# Patient Record
Sex: Female | Born: 1965
Health system: Southern US, Community
[De-identification: ages and names within clinical notes are randomized; demographics above are authoritative.]

## PROBLEM LIST (undated history)

## (undated) DIAGNOSIS — I10 Essential (primary) hypertension: Secondary | ICD-10-CM

## (undated) DIAGNOSIS — E039 Hypothyroidism, unspecified: Secondary | ICD-10-CM

## (undated) DIAGNOSIS — G500 Trigeminal neuralgia: Secondary | ICD-10-CM

## (undated) DIAGNOSIS — L719 Rosacea, unspecified: Secondary | ICD-10-CM

## (undated) HISTORY — DX: Trigeminal neuralgia: G50.0

## (undated) HISTORY — DX: Hypothyroidism, unspecified: E03.9

## (undated) HISTORY — PX: BUNIONECTOMY: SHX129

## (undated) HISTORY — DX: Rosacea, unspecified: L71.9

## (undated) HISTORY — PX: HAMMER TOE SURGERY: SHX385

---

## 2004-03-17 ENCOUNTER — Ambulatory Visit: Payer: Self-pay | Admitting: General Practice

## 2004-12-30 ENCOUNTER — Emergency Department: Payer: Self-pay | Admitting: Unknown Physician Specialty

## 2005-01-02 ENCOUNTER — Emergency Department: Payer: Self-pay | Admitting: Emergency Medicine

## 2005-01-06 ENCOUNTER — Emergency Department: Payer: Self-pay | Admitting: Emergency Medicine

## 2005-01-13 ENCOUNTER — Emergency Department: Payer: Self-pay | Admitting: Emergency Medicine

## 2005-01-29 ENCOUNTER — Emergency Department: Payer: Self-pay | Admitting: General Practice

## 2005-03-28 ENCOUNTER — Ambulatory Visit: Payer: Self-pay | Admitting: General Practice

## 2006-04-10 ENCOUNTER — Ambulatory Visit: Payer: Self-pay | Admitting: General Practice

## 2008-06-09 ENCOUNTER — Ambulatory Visit: Payer: Self-pay

## 2009-11-04 ENCOUNTER — Ambulatory Visit: Payer: Self-pay

## 2011-03-02 ENCOUNTER — Ambulatory Visit: Payer: Self-pay

## 2013-04-04 ENCOUNTER — Ambulatory Visit: Payer: Self-pay | Admitting: Obstetrics and Gynecology

## 2014-03-14 LAB — HM PAP SMEAR: HM PAP: NEGATIVE

## 2014-04-07 ENCOUNTER — Ambulatory Visit: Payer: Self-pay | Admitting: Obstetrics and Gynecology

## 2014-10-19 ENCOUNTER — Encounter: Payer: Self-pay | Admitting: *Deleted

## 2014-10-20 ENCOUNTER — Ambulatory Visit (INDEPENDENT_AMBULATORY_CARE_PROVIDER_SITE_OTHER): Payer: BLUE CROSS/BLUE SHIELD

## 2014-10-20 VITALS — BP 158/99 | HR 83 | Ht 65.0 in | Wt 181.0 lb

## 2014-10-20 DIAGNOSIS — R030 Elevated blood-pressure reading, without diagnosis of hypertension: Secondary | ICD-10-CM

## 2014-10-20 DIAGNOSIS — E669 Obesity, unspecified: Secondary | ICD-10-CM | POA: Diagnosis not present

## 2014-10-20 DIAGNOSIS — IMO0001 Reserved for inherently not codable concepts without codable children: Secondary | ICD-10-CM

## 2014-10-20 NOTE — Progress Notes (Cosign Needed)
Patient ID: Angela Boone, female   DOB: 1965/07/19, 49 y.o.   MRN: [id number]  Pt presents for wt, bp b12 inj.  NO s/e. Did not bring b12. Will have nurse at employer give it to her.  Pts bp is elevated today- 156/96, repeat- 158/99. Advised to d/c adipex until she speaks with mnb. Bp diary over the next few days. Will contact the office on Friday with an update from [facility].

## 2014-11-10 ENCOUNTER — Encounter: Payer: Self-pay | Admitting: Family Medicine

## 2014-11-10 ENCOUNTER — Ambulatory Visit (INDEPENDENT_AMBULATORY_CARE_PROVIDER_SITE_OTHER): Payer: BLUE CROSS/BLUE SHIELD | Admitting: Family Medicine

## 2014-11-10 VITALS — BP 155/94 | HR 87 | Temp 98.9°F | Ht 63.1 in | Wt 179.9 lb

## 2014-11-10 DIAGNOSIS — R609 Edema, unspecified: Secondary | ICD-10-CM | POA: Diagnosis not present

## 2014-11-10 DIAGNOSIS — Z13220 Encounter for screening for lipoid disorders: Secondary | ICD-10-CM | POA: Diagnosis not present

## 2014-11-10 DIAGNOSIS — G500 Trigeminal neuralgia: Secondary | ICD-10-CM | POA: Diagnosis not present

## 2014-11-10 DIAGNOSIS — E538 Deficiency of other specified B group vitamins: Secondary | ICD-10-CM

## 2014-11-10 DIAGNOSIS — E039 Hypothyroidism, unspecified: Secondary | ICD-10-CM

## 2014-11-10 DIAGNOSIS — I10 Essential (primary) hypertension: Secondary | ICD-10-CM | POA: Diagnosis not present

## 2014-11-10 DIAGNOSIS — R202 Paresthesia of skin: Secondary | ICD-10-CM

## 2014-11-10 DIAGNOSIS — I129 Hypertensive chronic kidney disease with stage 1 through stage 4 chronic kidney disease, or unspecified chronic kidney disease: Secondary | ICD-10-CM | POA: Insufficient documentation

## 2014-11-10 LAB — CBC WITH DIFFERENTIAL/PLATELET
HEMATOCRIT: 41.1 % (ref 34.0–46.6)
Hemoglobin: 14.5 g/dL (ref 11.1–15.9)
Lymphocytes Absolute: 1.2 10*3/uL (ref 0.7–3.1)
Lymphs: 23 %
MCH: 32.8 pg (ref 26.6–33.0)
MCHC: 35.3 g/dL (ref 31.5–35.7)
MCV: 93 fL (ref 79–97)
MID (ABSOLUTE): 0.5 10*3/uL (ref 0.1–1.6)
MID: 10 %
NEUTROS PCT: 67 %
Neutrophils Absolute: 3.6 10*3/uL (ref 1.4–7.0)
PLATELETS: 294 10*3/uL (ref 150–379)
RBC: 4.42 x10E6/uL (ref 3.77–5.28)
RDW: 12.7 % (ref 12.3–15.4)
WBC: 5.3 10*3/uL (ref 3.4–10.8)

## 2014-11-10 LAB — MICROALBUMIN, URINE WAIVED
Creatinine, Urine Waived: 50 mg/dL (ref 10–300)
MICROALB, UR WAIVED: 10 mg/L (ref 0–19)
Microalb/Creat Ratio: <30 mg/g (ref <30)

## 2014-11-10 MED ORDER — HYDROCHLOROTHIAZIDE 25 MG PO TABS: 25.0000 mg | ORAL_TABLET | Freq: Every day | ORAL | Status: DC

## 2014-11-10 NOTE — Patient Instructions (Signed)
Peripheral Edema You have swelling in your legs (peripheral edema). This swelling is due to excess accumulation of salt and water in your body. Edema may be a sign of heart, kidney or liver disease, or a side effect of a medication. It may also be due to problems in the leg veins. Elevating your legs and using special support stockings may be very helpful, if the cause of the swelling is due to poor venous circulation. Avoid long periods of standing, whatever the cause. Treatment of edema depends on identifying the cause. Chips, pretzels, pickles and other salty foods should be avoided. Restricting salt in your diet is almost always needed. Water pills (diuretics) are often used to remove the excess salt and water from your body via urine. These medicines prevent the kidney from reabsorbing sodium. This increases urine flow. Diuretic treatment may also result in lowering of potassium levels in your body. Potassium supplements may be needed if you have to use diuretics daily. Daily weights can help you keep track of your progress in clearing your edema. You should call your caregiver for follow up care as recommended. SEEK IMMEDIATE MEDICAL CARE IF:   You have increased swelling, pain, redness, or heat in your legs.  You develop shortness of breath, especially when lying down.  You develop chest or abdominal pain, weakness, or fainting.  You have a fever. Document Released: 05/25/2004 Document Revised: 07/10/2011 Document Reviewed: 05/05/2009 [facility] Patient Information 2015 [place], [place]. This information is not intended to replace advice given to you by your health care provider. Make sure you discuss any questions you have with your health care provider. DASH Eating Plan DASH stands for "Dietary Approaches to Stop Hypertension." The DASH eating plan is a healthy eating plan that has been shown to reduce high blood pressure (hypertension). Additional health benefits may include reducing the risk  of type 2 diabetes mellitus, heart disease, and stroke. The DASH eating plan may also help with weight loss. WHAT DO I NEED TO KNOW ABOUT THE DASH EATING PLAN? For the DASH eating plan, you will follow these general guidelines:  Choose foods with a percent daily value for sodium of less than 5% (as listed on the food label).  Use salt-free seasonings or herbs instead of table salt or sea salt.  Check with your health care provider or pharmacist before using salt substitutes.  Eat lower-sodium products, often labeled as "lower sodium" or "no salt added."  Eat fresh foods.  Eat more vegetables, fruits, and low-fat dairy products.  Choose whole grains. Look for the word "whole" as the first word in the ingredient list.  Choose fish and skinless chicken or [place] more often than red meat. Limit fish, poultry, and meat to 6 oz (170 g) each day.  Limit sweets, desserts, sugars, and sugary drinks.  Choose heart-healthy fats.  Limit cheese to 1 oz (28 g) per day.  Eat more home-cooked food and less restaurant, buffet, and fast food.  Limit fried foods.  Cook foods using methods other than frying.  Limit canned vegetables. If you do use them, rinse them well to decrease the sodium.  When eating at a restaurant, ask that your food be prepared with less salt, or no salt if possible. WHAT FOODS CAN I EAT? Seek help from a dietitian for individual calorie needs. Grains Whole grain or whole wheat bread. Brown rice. Whole grain or whole wheat pasta. Quinoa, bulgur, and whole grain cereals. Low-sodium cereals. Corn or whole wheat flour tortillas. Whole grain  cornbread. Whole grain crackers. Low-sodium crackers. Vegetables Fresh or frozen vegetables (raw, steamed, roasted, or grilled). Low-sodium or reduced-sodium tomato and vegetable juices. Low-sodium or reduced-sodium tomato sauce and paste. Low-sodium or reduced-sodium canned vegetables.  Fruits All fresh, canned (in natural juice), or  frozen fruits. Meat and Other Protein Products Ground beef (85% or leaner), grass-fed beef, or beef trimmed of fat. Skinless chicken or [place]. Ground chicken or [place]. Pork trimmed of fat. All fish and seafood. Eggs. Dried beans, peas, or lentils. Unsalted nuts and seeds. Unsalted canned beans. Dairy Low-fat dairy products, such as skim or 1% milk, 2% or reduced-fat cheeses, low-fat ricotta or cottage cheese, or plain low-fat yogurt. Low-sodium or reduced-sodium cheeses. Fats and Oils Tub margarines without trans fats. Light or reduced-fat mayonnaise and salad dressings (reduced sodium). Avocado. Safflower, olive, or canola oils. Natural peanut or almond butter. Other Unsalted popcorn and pretzels. The items listed above may not be a complete list of recommended foods or beverages. Contact your dietitian for more options. WHAT FOODS ARE NOT RECOMMENDED? Grains White bread. White pasta. White rice. Refined cornbread. Bagels and croissants. Crackers that contain trans fat. Vegetables Creamed or fried vegetables. Vegetables in a cheese sauce. Regular canned vegetables. Regular canned tomato sauce and paste. Regular tomato and vegetable juices. Fruits Dried fruits. Canned fruit in light or heavy syrup. Fruit juice. Meat and Other Protein Products Fatty cuts of meat. Ribs, chicken wings, bacon, sausage, bologna, salami, chitterlings, fatback, hot dogs, bratwurst, and packaged luncheon meats. Salted nuts and seeds. Canned beans with salt. Dairy Whole or 2% milk, cream, half-and-half, and cream cheese. Whole-fat or sweetened yogurt. Full-fat cheeses or blue cheese. Nondairy creamers and whipped toppings. Processed cheese, cheese spreads, or cheese curds. Condiments Onion and garlic salt, seasoned salt, table salt, and sea salt. Canned and packaged gravies. Worcestershire sauce. Tartar sauce. Barbecue sauce. Teriyaki sauce. Soy sauce, including reduced sodium. Steak sauce. Fish sauce. Oyster sauce.  Cocktail sauce. Horseradish. Ketchup and mustard. Meat flavorings and tenderizers. Bouillon cubes. Hot sauce. Tabasco sauce. Marinades. Taco seasonings. Relishes. Fats and Oils Butter, stick margarine, lard, shortening, ghee, and bacon fat. Coconut, palm kernel, or palm oils. Regular salad dressings. Other Pickles and olives. Salted popcorn and pretzels. The items listed above may not be a complete list of foods and beverages to avoid. Contact your dietitian for more information. WHERE CAN I FIND MORE INFORMATION? National Heart, Lung, and Blood Institute: [employer] Document Released: 04/06/2011 Document Revised: 09/01/2013 Document Reviewed: 02/19/2013 [facility] Patient Information 2015 [place], [place]. This information is not intended to replace advice given to you by your health care provider. Make sure you discuss any questions you have with your health care provider.

## 2014-11-10 NOTE — Assessment & Plan Note (Signed)
Will check TSH today to determine if her levels are doing OK or not. Await results. Adjust as needed.

## 2014-11-10 NOTE — Progress Notes (Signed)
BP 155/94 mmHg  Pulse 87  Temp(Src) 98.9 F (37.2 C)  Ht 5' 3.1" (1.603 m)  Wt 179 lb 14.4 oz (81.602 kg)  BMI 31.76 kg/m2  SpO2 99%   Subjective:    Patient ID: Angela Boone, female    DOB: 09-08-1965, 49 y.o.   MRN: [id number]  HPI: Angela Boone is a 49 y.o. female presents to establish care.   Chief Complaint  Patient presents with  . Hypertension  . Leg Swelling    lower left leg, happens about twice a week  . Numbness    left finger tips will go numb   HYPERTENSION- had a foot injury and started gaining weight, blood pressure started going up Hypertension status: uncontrolled  Satisfied with current treatment? no Duration of hypertension: 3-4 months BP monitoring frequency:  rarely BP range: 150-180 /80+ Aspirin: no Recurrent headaches: yes Visual changes: no Palpitations: no Dyspnea: no Chest pain: no Lower extremity edema: yes- usually off and on about 2x a week. Had ultrasound done about 2 years ago and they noted  Dizzy/lightheaded: no  HYPOTHYROIDISM Thyroid control status:unsure about dose Satisfied with current treatment? no Medication side effects: no Medication compliance: excellent compliance Recent dose adjustment:yes- unclear Fatigue: yes Cold intolerance: no Heat intolerance: no Weight gain: no Weight loss: no Constipation: no Diarrhea/loose stools: no Palpitations: no Lower extremity edema: yes Anxiety/depressed mood: yes  Relevant past medical, surgical, family and social history reviewed and updated as indicated. Interim medical history since our last visit reviewed. Allergies and medications reviewed and updated.  Review of Systems  Constitutional: Negative.   Respiratory: Negative.   Gastrointestinal: Negative.   Genitourinary: Negative.   Psychiatric/Behavioral: Negative for suicidal ideas, hallucinations, behavioral problems, confusion, sleep disturbance, self-injury, dysphoric mood, decreased concentration and  agitation. The patient is nervous/anxious. The patient is not hyperactive.     Per HPI unless specifically indicated above     Objective:    BP 155/94 mmHg  Pulse 87  Temp(Src) 98.9 F (37.2 C)  Ht 5' 3.1" (1.603 m)  Wt 179 lb 14.4 oz (81.602 kg)  BMI 31.76 kg/m2  SpO2 99%  Wt Readings from Last 3 Encounters:  11/10/14 179 lb 14.4 oz (81.602 kg)  10/20/14 181 lb (82.101 kg)    Physical Exam  Constitutional: She is oriented to person, place, and time. She appears well-developed and well-nourished. No distress.  HENT:  Head: Normocephalic and atraumatic.  Right Ear: Hearing normal.  Left Ear: Hearing normal.  Nose: Nose normal.  Eyes: Conjunctivae and lids are normal. Right eye exhibits no discharge. Left eye exhibits no discharge. No scleral icterus.  Cardiovascular: Normal rate, regular rhythm, normal heart sounds and intact distal pulses.  Exam reveals no gallop and no friction rub.   No murmur heard. Pulmonary/Chest: Effort normal and breath sounds normal. No respiratory distress. She has no wheezes. She has no rales. She exhibits no tenderness.  Musculoskeletal: Normal range of motion.  Neurological: She is alert and oriented to person, place, and time.  Skin: Skin is warm, dry and intact. No rash noted. No erythema. No pallor.  Psychiatric: She has a normal mood and affect. Her speech is normal and behavior is normal. Judgment and thought content normal. Cognition and memory are normal.    Results for orders placed or performed in visit on 10/19/14  HM PAP SMEAR  Result Value Ref Range   HM Pap smear negative, HPV negative       Assessment & Plan:  Problem List Items Addressed This Visit      Cardiovascular and Mediastinum   Hypertension - Primary    Has tried dietary changes without benefit. Work on exercise. DASH diet given today. Microalbumin was negative- will start HCTZ to help with the swelling. Recheck with BMP in 1 month.       Relevant Medications    hydrochlorothiazide (HYDRODIURIL) 25 MG tablet   Other Relevant Orders   Comprehensive metabolic panel   Microalbumin, Urine Waived   Basic metabolic panel     Endocrine   Hypothyroidism    Will check TSH today to determine if her levels are doing OK or not. Await results. Adjust as needed.       Relevant Medications   Levothyroxine Sodium 100 MCG CAPS   Other Relevant Orders   Comprehensive metabolic panel   TSH     Nervous and Auditory   Trigeminal neuralgia    Continue to follow with neurology.        Other Visit Diagnoses    Paresthesias        Possibly due to her thyroid or B12. Await blood work.     Relevant Orders    CBC With Differential/Platelet    Comprehensive metabolic panel    Vit D  25 hydroxy (rtn osteoporosis monitoring)    Edema        Checking labs today. Starting HCTZ. Previous vascular eval was negative. No sign of DVT today.     Relevant Orders    CBC With Differential/Platelet    Comprehensive metabolic panel    Vit D  25 hydroxy (rtn osteoporosis monitoring)    Screening for cholesterol level        Checking levels today. Await results.     Relevant Orders    Lipid Panel w/o Chol/HDL Ratio    B12 deficiency        Rechecking levels today. Treat as needed.     Relevant Orders    B12        Follow up plan: Return in about 4 weeks (around 12/08/2014) for BP check with BMP.

## 2014-11-10 NOTE — Assessment & Plan Note (Signed)
Has tried dietary changes without benefit. Work on exercise. DASH diet given today. Microalbumin was negative- will start HCTZ to help with the swelling. Recheck with BMP in 1 month.

## 2014-11-10 NOTE — Assessment & Plan Note (Signed)
Continue to follow with neurology.

## 2014-11-11 LAB — COMPREHENSIVE METABOLIC PANEL
A/G RATIO: 1.8 (ref 1.1–2.5)
ALT: 25 IU/L (ref 0–32)
AST: 23 IU/L (ref 0–40)
Albumin: 4.2 g/dL (ref 3.5–5.5)
Alkaline Phosphatase: 65 IU/L (ref 39–117)
BUN/Creatinine Ratio: 14 (ref 9–23)
BUN: 9 mg/dL (ref 6–24)
Bilirubin Total: <0.2 mg/dL (ref 0.0–1.2)
CHLORIDE: 92 mmol/L — AB (ref 97–108)
CO2: 21 mmol/L (ref 18–29)
Calcium: 9.1 mg/dL (ref 8.7–10.2)
Creatinine, Ser: 0.65 mg/dL (ref 0.57–1.00)
GFR calc Af Amer: 121 mL/min/{1.73_m2} (ref >59)
GFR calc non Af Amer: 105 mL/min/{1.73_m2} (ref >59)
GLOBULIN, TOTAL: 2.3 g/dL (ref 1.5–4.5)
Glucose: 89 mg/dL (ref 65–99)
Potassium: 5 mmol/L (ref 3.5–5.2)
Sodium: 129 mmol/L — ABNORMAL LOW (ref 134–144)
TOTAL PROTEIN: 6.5 g/dL (ref 6.0–8.5)

## 2014-11-11 LAB — LIPID PANEL W/O CHOL/HDL RATIO
Cholesterol, Total: 205 mg/dL — ABNORMAL HIGH (ref 100–199)
HDL: 82 mg/dL (ref >39)
LDL CALC: 109 mg/dL — AB (ref 0–99)
Triglycerides: 70 mg/dL (ref 0–149)
VLDL CHOLESTEROL CAL: 14 mg/dL (ref 5–40)

## 2014-11-12 ENCOUNTER — Telehealth: Payer: Self-pay | Admitting: Family Medicine

## 2014-11-12 DIAGNOSIS — E039 Hypothyroidism, unspecified: Secondary | ICD-10-CM

## 2014-11-12 DIAGNOSIS — E559 Vitamin D deficiency, unspecified: Secondary | ICD-10-CM

## 2014-11-12 LAB — TSH: TSH: 0.16 u[IU]/mL — AB (ref 0.450–4.500)

## 2014-11-12 LAB — VITAMIN B12: Vitamin B-12: 411 pg/mL (ref 211–946)

## 2014-11-12 LAB — VITAMIN D 25 HYDROXY (VIT D DEFICIENCY, FRACTURES): Vit D, 25-Hydroxy: 22 ng/mL — ABNORMAL LOW (ref 30.0–100.0)

## 2014-11-12 MED ORDER — LEVOTHYROXINE SODIUM 88 MCG PO TABS: 88.0000 ug | ORAL_TABLET | Freq: Every day | ORAL | Status: DC

## 2014-11-12 NOTE — Telephone Encounter (Signed)
Please let her know that her cholesterol is borderline high- but nothing we need to treat right now, just work on diet and exercise. Her thyroid is over treated, which may be the cause of the numbness and some of the anxiousness. I'm sending in a new Rx to her pharmacy. She should come back in in 6 weeks for a lab draw (OK to make that appointment for her- also OK to change her BP check appointment to 6 weeks rather than 4 if she'd like). Vitamin D is also very slightly low- take 200IU of D3 OTC and we'll recheck it at the same time. Thank you!

## 2014-11-12 NOTE — Telephone Encounter (Signed)
Patient notified of lab results, changed her appointment to 6weeks.

## 2014-12-08 ENCOUNTER — Ambulatory Visit: Payer: BLUE CROSS/BLUE SHIELD | Admitting: Family Medicine

## 2014-12-17 ENCOUNTER — Other Ambulatory Visit: Payer: Self-pay | Admitting: Family Medicine

## 2014-12-17 NOTE — Telephone Encounter (Signed)
She should not need a refill Dr. Laural Benes wrote for 30+1 in July and needs labs drawn before she runs out of this Rx

## 2014-12-17 NOTE — Telephone Encounter (Signed)
Patient notified

## 2014-12-22 ENCOUNTER — Ambulatory Visit (INDEPENDENT_AMBULATORY_CARE_PROVIDER_SITE_OTHER): Payer: BLUE CROSS/BLUE SHIELD | Admitting: Family Medicine

## 2014-12-22 ENCOUNTER — Encounter: Payer: Self-pay | Admitting: Family Medicine

## 2014-12-22 ENCOUNTER — Other Ambulatory Visit: Payer: Self-pay | Admitting: Family Medicine

## 2014-12-22 VITALS — BP 138/90 | HR 88 | Temp 99.1°F | Wt 181.3 lb

## 2014-12-22 DIAGNOSIS — E039 Hypothyroidism, unspecified: Secondary | ICD-10-CM

## 2014-12-22 DIAGNOSIS — I10 Essential (primary) hypertension: Secondary | ICD-10-CM | POA: Diagnosis not present

## 2014-12-22 DIAGNOSIS — E559 Vitamin D deficiency, unspecified: Secondary | ICD-10-CM | POA: Diagnosis not present

## 2014-12-22 NOTE — Progress Notes (Signed)
BP 138/90 mmHg  Pulse 88  Temp(Src) 99.1 F (37.3 C)  Wt 181 lb 4.8 oz (82.237 kg)  SpO2 99%   Subjective:    Patient ID: Angela Boone, female    DOB: Jun 07, 1965, 49 y.o.   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 Hypothyroidism  . Hypertension   HYPERTENSION Hypertension status: uncontrolled but better on recheck. Very stressed today at work Satisfied with current treatment? yes Duration of hypertension: chronic BP monitoring frequency:  not checking BP medication side effects:  no Medication compliance: good compliance Aspirin: no Recurrent headaches: no Visual changes: no Palpitations: no Dyspnea: no Chest pain: no Lower extremity edema: no Dizzy/lightheaded: no  HYPOTHYROIDISM Thyroid control status:stable Satisfied with current treatment? yes Medication side effects: no Medication compliance: excellent compliance Recent dose adjustment:no Fatigue: no Cold intolerance: no Heat intolerance: no Weight gain: no Weight loss: no Constipation: no Diarrhea/loose stools: no Palpitations: no Lower extremity edema: no Anxiety/depressed mood: no  Due today for recheck on vitamin D as it was slightly low last time. Has been taking OTC vitamin D.  Relevant past medical, surgical, family and social history reviewed and updated as indicated. Interim medical history since our last visit reviewed. Allergies and medications reviewed and updated.  Review of Systems  Constitutional: Negative.   Respiratory: Negative.   Cardiovascular: Negative.   Musculoskeletal: Negative.   Neurological: Negative.   Psychiatric/Behavioral: Negative.     Per HPI unless specifically indicated above     Objective:    BP 138/90 mmHg  Pulse 88  Temp(Src) 99.1 F (37.3 C)  Wt 181 lb 4.8 oz (82.237 kg)  SpO2 99%  Wt Readings from Last 3 Encounters:  12/22/14 181 lb 4.8 oz (82.237 kg)  11/10/14 179 lb 14.4 oz  (81.602 kg)  10/20/14 181 lb (82.101 kg)    Physical Exam  Constitutional: She is oriented to person, place, and time. She appears well-developed and well-nourished. No distress.  HENT:  Head: Normocephalic and atraumatic.  Right Ear: Hearing normal.  Left Ear: Hearing normal.  Nose: Nose normal.  Eyes: Conjunctivae and lids are normal. Right eye exhibits no discharge. Left eye exhibits no discharge. No scleral icterus.  Cardiovascular: Normal rate, regular rhythm, normal heart sounds and intact distal pulses.  Exam reveals no gallop and no friction rub.   No murmur heard. Pulmonary/Chest: Effort normal and breath sounds normal. No respiratory distress. She has no wheezes. She has no rales. She exhibits no tenderness.  Musculoskeletal: Normal range of motion.  Neurological: She is alert and oriented to person, place, and time.  Skin: Skin is intact. No rash noted.  Psychiatric: She has a normal mood and affect. Her speech is normal and behavior is normal. Judgment and thought content normal. Cognition and memory are normal.  Nursing note and vitals reviewed.   Results for orders placed or performed in visit on 11/10/14  Comprehensive metabolic panel  Result Value Ref Range   Glucose 89 65 - 99 mg/dL   BUN 9 6 - 24 mg/dL   Creatinine, Ser 4.09 0.57 - 1.00 mg/dL   GFR calc non Af Amer 105 >59 mL/min/1.73   GFR calc Af Amer 121 >59 mL/min/1.73   BUN/Creatinine Ratio 14 9 - 23   Sodium 129 (L) 134 - 144 mmol/L   Potassium 5.0 3.5 - 5.2 mmol/L   Chloride 92 (L) 97 - 108 mmol/L   CO2 21  18 - 29 mmol/L   Calcium 9.1 8.7 - 10.2 mg/dL   Total Protein 6.5 6.0 - 8.5 g/dL   Albumin 4.2 3.5 - 5.5 g/dL   Globulin, Total 2.3 1.5 - 4.5 g/dL   Albumin/Globulin Ratio 1.8 1.1 - 2.5   Bilirubin Total <0.2 0.0 - 1.2 mg/dL   Alkaline Phosphatase 65 39 - 117 IU/L   AST 23 0 - 40 IU/L   ALT 25 0 - 32 IU/L  Lipid Panel w/o Chol/HDL Ratio  Result Value Ref Range   Cholesterol, Total 205 (H) 100 -  199 mg/dL   Triglycerides 70 0 - 149 mg/dL   HDL 82 >81 mg/dL   VLDL Cholesterol Cal 14 5 - 40 mg/dL   LDL Calculated 191 (H) 0 - 99 mg/dL  Microalbumin, Urine Waived  Result Value Ref Range   Microalb, Ur Waived 10 0 - 19 mg/L   Creatinine, Urine Waived 50 10 - 300 mg/dL   Microalb/Creat Ratio <30 <30 mg/g  TSH  Result Value Ref Range   TSH 0.160 (L) 0.450 - 4.500 uIU/mL  Vit D  25 hydroxy (rtn osteoporosis monitoring)  Result Value Ref Range   Vit D, 25-Hydroxy 22.0 (L) 30.0 - 100.0 ng/mL  B12  Result Value Ref Range   Vitamin B-12 411 211 - 946 pg/mL  CBC With Differential/Platelet  Result Value Ref Range   WBC 5.3 3.4 - 10.8 x10E3/uL   RBC 4.42 3.77 - 5.28 x10E6/uL   Hemoglobin 14.5 11.1 - 15.9 g/dL   Hematocrit 47.8 29.5 - 46.6 %   MCV 93 79 - 97 fL   MCH 32.8 26.6 - 33.0 pg   MCHC 35.3 31.5 - 35.7 g/dL   RDW 62.1 30.8 - 65.7 %   Platelets 294 150 - 379 x10E3/uL   Neutrophils 67 %   Lymphs 23 %   MID 10 %   Neutrophils Absolute 3.6 1.4 - 7.0 x10E3/uL   Lymphocytes Absolute 1.2 0.7 - 3.1 x10E3/uL   MID (Absolute) 0.5 0.1 - 1.6 X10E3/uL      Assessment & Plan:   Problem List Items Addressed This Visit      Cardiovascular and Mediastinum   Hypertension - Primary    Here today for recheck. Not under great control on the HCTZ, but better on recheck. Work on [employer] and decreasing stress.  BMP checked today. Continue to monitor.       Relevant Orders   Basic metabolic panel     Endocrine   Hypothyroidism    Due for recheck today. Labs drawn. Await results and adjust as needed.       Relevant Orders   TSH     Other   Vitamin D deficiency    Due for recheck today. Drawn. Will replace as needed.       Relevant Orders   Vit D  25 hydroxy (rtn osteoporosis monitoring)       Follow up plan: Return in about 6 months (around 06/24/2015).

## 2014-12-22 NOTE — Assessment & Plan Note (Signed)
Due for recheck today. Labs drawn. Await results and adjust as needed.

## 2014-12-22 NOTE — Patient Instructions (Signed)
Managing Your High Blood Pressure Blood pressure is a measurement of how forceful your blood is pressing against the walls of the arteries. Arteries are muscular tubes within the circulatory system. Blood pressure does not stay the same. Blood pressure rises when you are active, excited, or nervous; and it lowers during sleep and relaxation. If the numbers measuring your blood pressure stay above normal most of the time, you are at risk for health problems. High blood pressure (hypertension) is a long-term (chronic) condition in which blood pressure is elevated. A blood pressure reading is recorded as two numbers, such as 120 over 80 (or 120/80). The first, higher number is called the systolic pressure. It is a measure of the pressure in your arteries as the heart beats. The second, lower number is called the diastolic pressure. It is a measure of the pressure in your arteries as the heart relaxes between beats.  Keeping your blood pressure in a normal range is important to your overall health and prevention of health problems, such as heart disease and stroke. When your blood pressure is uncontrolled, your heart has to work harder than normal. High blood pressure is a very common condition in adults because blood pressure tends to rise with age. Men and women are equally likely to have hypertension but at different times in life. Before age 45, men are more likely to have hypertension. After 49 years of age, women are more likely to have it. Hypertension is especially common in African Americans. This condition often has no signs or symptoms. The cause of the condition is usually not known. Your caregiver can help you come up with a plan to keep your blood pressure in a normal, healthy range. BLOOD PRESSURE STAGES Blood pressure is classified into four stages: normal, prehypertension, stage 1, and stage 2. Your blood pressure reading will be used to determine what type of treatment, if any, is necessary.  Appropriate treatment options are tied to these four stages:  Normal  Systolic pressure (mm Hg): below 120.  Diastolic pressure (mm Hg): below 80. Prehypertension  Systolic pressure (mm Hg): 120 to 139.  Diastolic pressure (mm Hg): 80 to 89. Stage1  Systolic pressure (mm Hg): 140 to 159.  Diastolic pressure (mm Hg): 90 to 99. Stage2  Systolic pressure (mm Hg): 160 or above.  Diastolic pressure (mm Hg): 100 or above. RISKS RELATED TO HIGH BLOOD PRESSURE Managing your blood pressure is an important responsibility. Uncontrolled high blood pressure can lead to:  A heart attack.  A stroke.  A weakened blood vessel (aneurysm).  Heart failure.  Kidney damage.  Eye damage.  Metabolic syndrome.  Memory and concentration problems. HOW TO MANAGE YOUR BLOOD PRESSURE Blood pressure can be managed effectively with lifestyle changes and medicines (if needed). Your caregiver will help you come up with a plan to bring your blood pressure within a normal range. Your plan should include the following: Education  Read all information provided by your caregivers about how to control blood pressure.  Educate yourself on the latest guidelines and treatment recommendations. New research is always being done to further define the risks and treatments for high blood pressure. Lifestylechanges  Control your weight.  Avoid smoking.  Stay physically active.  Reduce the amount of salt in your diet.  Reduce stress.  Control any chronic conditions, such as high cholesterol or diabetes.  Reduce your alcohol intake. Medicines  Several medicines (antihypertensive medicines) are available, if needed, to bring blood pressure within a normal range.   Communication  Review all the medicines you take with your caregiver because there may be side effects or interactions.  Talk with your caregiver about your diet, exercise habits, and other lifestyle factors that may be contributing to  high blood pressure.  See your caregiver regularly. Your caregiver can help you create and adjust your plan for managing high blood pressure. RECOMMENDATIONS FOR TREATMENT AND FOLLOW-UP  The following recommendations are based on current guidelines for managing high blood pressure in nonpregnant adults. Use these recommendations to identify the proper follow-up period or treatment option based on your blood pressure reading. You can discuss these options with your caregiver.  Systolic pressure of 120 to 139 or diastolic pressure of 80 to 89: Follow up with your caregiver as directed.  Systolic pressure of 140 to 160 or diastolic pressure of 90 to 100: Follow up with your caregiver within 2 months.  Systolic pressure above 160 or diastolic pressure above 100: Follow up with your caregiver within 1 month.  Systolic pressure above 180 or diastolic pressure above 110: Consider antihypertensive therapy; follow up with your caregiver within 1 week.  Systolic pressure above 200 or diastolic pressure above 120: Begin antihypertensive therapy; follow up with your caregiver within 1 week. Document Released: 01/10/2012 Document Reviewed: 01/10/2012 ExitCare Patient Information 2015 [employer]. This information is not intended to replace advice given to you by your health care provider. Make sure you discuss any questions you have with your health care provider.  DASH Eating Plan DASH stands for "Dietary Approaches to Stop Hypertension." The DASH eating plan is a healthy eating plan that has been shown to reduce high blood pressure (hypertension). Additional health benefits may include reducing the risk of type 2 diabetes mellitus, heart disease, and stroke. The DASH eating plan may also help with weight loss. WHAT DO I NEED TO KNOW ABOUT THE DASH EATING PLAN? For the DASH eating plan, you will follow these general guidelines:  Choose foods with a percent daily value for sodium of less than 5% (as  listed on the food label).  Use salt-free seasonings or herbs instead of table salt or sea salt.  Check with your health care provider or pharmacist before using salt substitutes.  Eat lower-sodium products, often labeled as "lower sodium" or "no salt added."  Eat fresh foods.  Eat more vegetables, fruits, and low-fat dairy products.  Choose whole grains. Look for the word "whole" as the first word in the ingredient list.  Choose fish and skinless chicken or turkey more often than red meat. Limit fish, poultry, and meat to 6 oz (170 g) each day.  Limit sweets, desserts, sugars, and sugary drinks.  Choose heart-healthy fats.  Limit cheese to 1 oz (28 g) per day.  Eat more home-cooked food and less restaurant, buffet, and fast food.  Limit fried foods.  Cook foods using methods other than frying.  Limit canned vegetables. If you do use them, rinse them well to decrease the sodium.  When eating at a restaurant, ask that your food be prepared with less salt, or no salt if possible. WHAT FOODS CAN I EAT? Seek help from a dietitian for individual calorie needs. Grains Whole grain or whole wheat bread. Brown rice. Whole grain or whole wheat pasta. Quinoa, bulgur, and whole grain cereals. Low-sodium cereals. Corn or whole wheat flour tortillas. Whole grain cornbread. Whole grain crackers. Low-sodium crackers. Vegetables Fresh or frozen vegetables (raw, steamed, roasted, or grilled). Low-sodium or reduced-sodium tomato and vegetable juices. Low-sodium   or reduced-sodium tomato sauce and paste. Low-sodium or reduced-sodium canned vegetables.  Fruits All fresh, canned (in natural juice), or frozen fruits. Meat and Other Protein Products Ground beef (85% or leaner), grass-fed beef, or beef trimmed of fat. Skinless chicken or turkey. Ground chicken or turkey. Pork trimmed of fat. All fish and seafood. Eggs. Dried beans, peas, or lentils. Unsalted nuts and seeds. Unsalted canned  beans. Dairy Low-fat dairy products, such as skim or 1% milk, 2% or reduced-fat cheeses, low-fat ricotta or cottage cheese, or plain low-fat yogurt. Low-sodium or reduced-sodium cheeses. Fats and Oils Tub margarines without trans fats. Light or reduced-fat mayonnaise and salad dressings (reduced sodium). Avocado. Safflower, olive, or canola oils. Natural peanut or almond butter. Other Unsalted popcorn and pretzels. The items listed above may not be a complete list of recommended foods or beverages. Contact your dietitian for more options. WHAT FOODS ARE NOT RECOMMENDED? Grains White bread. White pasta. White rice. Refined cornbread. Bagels and croissants. Crackers that contain trans fat. Vegetables Creamed or fried vegetables. Vegetables in a cheese sauce. Regular canned vegetables. Regular canned tomato sauce and paste. Regular tomato and vegetable juices. Fruits Dried fruits. Canned fruit in light or heavy syrup. Fruit juice. Meat and Other Protein Products Fatty cuts of meat. Ribs, chicken wings, bacon, sausage, bologna, salami, chitterlings, fatback, hot dogs, bratwurst, and packaged luncheon meats. Salted nuts and seeds. Canned beans with salt. Dairy Whole or 2% milk, cream, half-and-half, and cream cheese. Whole-fat or sweetened yogurt. Full-fat cheeses or blue cheese. Nondairy creamers and whipped toppings. Processed cheese, cheese spreads, or cheese curds. Condiments Onion and garlic salt, seasoned salt, table salt, and sea salt. Canned and packaged gravies. Worcestershire sauce. Tartar sauce. Barbecue sauce. Teriyaki sauce. Soy sauce, including reduced sodium. Steak sauce. Fish sauce. Oyster sauce. Cocktail sauce. Horseradish. Ketchup and mustard. Meat flavorings and tenderizers. Bouillon cubes. Hot sauce. Tabasco sauce. Marinades. Taco seasonings. Relishes. Fats and Oils Butter, stick margarine, lard, shortening, ghee, and bacon fat. Coconut, palm kernel, or palm oils. Regular salad  dressings. Other Pickles and olives. Salted popcorn and pretzels. The items listed above may not be a complete list of foods and beverages to avoid. Contact your dietitian for more information. WHERE CAN I FIND MORE INFORMATION? National Heart, Lung, and [employer] Institute: www.nhlbi.nih.gov/health/health-topics/topics/dash/ Document Released: 04/06/2011 Document Revised: 09/01/2013 Document Reviewed: 02/19/2013 ExitCare Patient Information 2015 [employer]. This information is not intended to replace advice given to you by your health care provider. Make sure you discuss any questions you have with your health care provider.  

## 2014-12-22 NOTE — Assessment & Plan Note (Addendum)
Here today for recheck. Not under great control on the HCTZ, but better on recheck. Work on [employer] and decreasing stress.  BMP checked today. Continue to monitor.

## 2014-12-22 NOTE — Assessment & Plan Note (Signed)
Due for recheck today. Drawn. Will replace as needed.

## 2014-12-23 ENCOUNTER — Encounter: Payer: Self-pay | Admitting: Family Medicine

## 2014-12-23 LAB — BASIC METABOLIC PANEL
BUN / CREAT RATIO: 13 (ref 9–23)
BUN: 10 mg/dL (ref 6–24)
CO2: 22 mmol/L (ref 18–29)
CREATININE: 0.75 mg/dL (ref 0.57–1.00)
Calcium: 9.2 mg/dL (ref 8.7–10.2)
Chloride: 84 mmol/L — ABNORMAL LOW (ref 97–108)
GFR calc Af Amer: 108 mL/min/{1.73_m2} (ref >59)
GFR, EST NON AFRICAN AMERICAN: 94 mL/min/{1.73_m2} (ref >59)
GLUCOSE: 95 mg/dL (ref 65–99)
Potassium: 3.6 mmol/L (ref 3.5–5.2)
Sodium: 124 mmol/L — ABNORMAL LOW (ref 134–144)

## 2014-12-23 LAB — VITAMIN D 25 HYDROXY (VIT D DEFICIENCY, FRACTURES): VIT D 25 HYDROXY: 30.4 ng/mL (ref 30.0–100.0)

## 2014-12-23 LAB — TSH: TSH: 0.865 u[IU]/mL (ref 0.450–4.500)

## 2015-01-03 ENCOUNTER — Encounter: Payer: Self-pay | Admitting: *Deleted

## 2015-01-03 ENCOUNTER — Emergency Department
Admission: EM | Admit: 2015-01-03 | Discharge: 2015-01-03 | Disposition: A | Discharge status: Home / Self Care | Payer: BLUE CROSS/BLUE SHIELD | Attending: Emergency Medicine | Admitting: Emergency Medicine

## 2015-01-03 ENCOUNTER — Emergency Department: Payer: BLUE CROSS/BLUE SHIELD

## 2015-01-03 DIAGNOSIS — Z79899 Other long term (current) drug therapy: Secondary | ICD-10-CM | POA: Diagnosis not present

## 2015-01-03 DIAGNOSIS — I10 Essential (primary) hypertension: Secondary | ICD-10-CM | POA: Insufficient documentation

## 2015-01-03 DIAGNOSIS — R42 Dizziness and giddiness: Secondary | ICD-10-CM | POA: Insufficient documentation

## 2015-01-03 DIAGNOSIS — Z792 Long term (current) use of antibiotics: Secondary | ICD-10-CM | POA: Diagnosis not present

## 2015-01-03 DIAGNOSIS — R079 Chest pain, unspecified: Secondary | ICD-10-CM

## 2015-01-03 DIAGNOSIS — R112 Nausea with vomiting, unspecified: Secondary | ICD-10-CM | POA: Diagnosis not present

## 2015-01-03 LAB — BASIC METABOLIC PANEL
Anion gap: 10 (ref 5–15)
BUN: 7 mg/dL (ref 6–20)
CALCIUM: 8.9 mg/dL (ref 8.9–10.3)
CO2: 27 mmol/L (ref 22–32)
CREATININE: 0.86 mg/dL (ref 0.44–1.00)
Chloride: 93 mmol/L — ABNORMAL LOW (ref 101–111)
GFR calc Af Amer: >60 mL/min (ref >60)
GLUCOSE: 135 mg/dL — AB (ref 65–99)
Potassium: 3.3 mmol/L — ABNORMAL LOW (ref 3.5–5.1)
Sodium: 130 mmol/L — ABNORMAL LOW (ref 135–145)

## 2015-01-03 LAB — TROPONIN I

## 2015-01-03 LAB — CBC
HCT: 41.2 % (ref 35.0–47.0)
Hemoglobin: 14.6 g/dL (ref 12.0–16.0)
MCH: 32.8 pg (ref 26.0–34.0)
MCHC: 35.3 g/dL (ref 32.0–36.0)
MCV: 92.8 fL (ref 80.0–100.0)
PLATELETS: 312 10*3/uL (ref 150–440)
RBC: 4.44 MIL/uL (ref 3.80–5.20)
RDW: 12.5 % (ref 11.5–14.5)
WBC: 7.4 10*3/uL (ref 3.6–11.0)

## 2015-01-03 NOTE — Discharge Instructions (Signed)

## 2015-01-03 NOTE — ED Notes (Signed)
Patient transported to X-ray 

## 2015-01-03 NOTE — ED Notes (Signed)
Pt woke from sleep w/ sudden onset L chest pain accompanied by diaphoresis, dizziness, nausea, radiation to L arm. Pt presently in no acute distress, although reports continued chest heaviness. Pt has hx of HTN and trigeminal neuralgia and hypothyroidism.

## 2015-01-03 NOTE — ED Notes (Signed)
Patient returns from X ray.

## 2015-01-03 NOTE — ED Provider Notes (Signed)
[facility] Emergency Department Provider Note  ____________________________________________  Time seen: Approximately 7:04 AM  I have reviewed the triage vital signs and the nursing notes.   HISTORY  Chief Complaint Chest Pain    HPI BRIDGIT EYNON is a 49 y.o. female with a history of hypertension who is presenting today with chest pain. She said the pain woke her from sleep at 4:30 AM. She describes the pain as a heaviness under her left breast. She describes associated nausea, vomiting and dizziness. She said that the symptoms lasted about one half hours and have resolved spontaneously. She says that she feels "100% better." Denies any pain with deep inspiration. Does take a progestin based birth control pill. Denies any recent long car trips or plane trips. Says that she does have a family history of heart disease in her father who had an MI at 56 results. She says that she is very active and has not been having any chest pain or shortness of breath with activity lately. Despite the triage note when Asked she says that the pain did not radiate. The pain did not worsen with movement or exertion. Denies any shortness of breath.   Past Medical History  Diagnosis Date  . Hypothyroidism   . Trigeminal neuralgia   . Rosacea     Patient Active Problem List   Diagnosis Date Noted  . Vitamin D deficiency 12/22/2014  . Hypothyroidism 11/10/2014  . Hypertension 11/10/2014  . Trigeminal neuralgia 11/10/2014    History reviewed. No pertinent past surgical history.  Current Outpatient Rx  Name  Route  Sig  Dispense  Refill  . doxycycline (VIBRAMYCIN) 50 MG capsule   Oral   Take 50 mg by mouth daily. Routine medication         . gabapentin (NEURONTIN) 300 MG capsule   Oral   Take 600 mg by mouth 2 (two) times daily. Take two capsules in the morning and three capsules in the evening         . hydrochlorothiazide (HYDRODIURIL) 25 MG tablet   Oral    Take 1 tablet (25 mg total) by mouth daily.   90 tablet   1   . levothyroxine (SYNTHROID, LEVOTHROID) 88 MCG tablet   Oral   Take 1 tablet (88 mcg total) by mouth daily.   30 tablet   1   . norethindrone (MICRONOR,CAMILA,ERRIN) 0.35 MG tablet   Oral   Take 1 tablet by mouth daily.      4   . OXcarbazepine (TRILEPTAL) 150 MG tablet   Oral   Take 450-600 mg by mouth See admin instructions. Takes 3 tablets (450mg ) orally in the morning and 4 tablets (600mg ) orally at bedtime.         . phentermine (ADIPEX-P) 37.5 MG tablet   Intramuscular   Inject 37.5 mg into the muscle every 30 (thirty) days.           Allergies Review of patient's allergies indicates no known allergies.  Family History  Problem Relation Age of Onset  . Cancer Mother     breast  . Hypertension Mother   . Cancer Father     lung  . Heart disease Father   . Diabetes Son 8    Type 1  . Cancer Maternal Grandmother     breast  . Cancer Maternal Grandfather     colon  . Heart disease Paternal Grandfather   . Hypertension Brother     Social History  Social History  Substance Use Topics  . Smoking status: Never Smoker   . Smokeless tobacco: Never Used  . Alcohol Use: Yes     Comment: occas    Review of Systems Constitutional: No fever/chills Eyes: No visual changes. ENT: No sore throat. Cardiovascular: As above  Respiratory: Denies shortness of breath. Gastrointestinal: No abdominal pain.  No nausea, no vomiting.  No diarrhea.  No constipation. Genitourinary: Negative for dysuria. Musculoskeletal: Negative for back pain. Skin: Negative for rash. Neurological: Negative for headaches, focal weakness or numbness.  10-point ROS otherwise negative.  ____________________________________________   PHYSICAL EXAM:  VITAL SIGNS: ED Triage Vitals  Enc Vitals Group     BP 01/03/15 0521 113/74 mmHg     Pulse Rate 01/03/15 0521 68     Resp 01/03/15 0521 16     Temp 01/03/15 0521 97.7 F  (36.5 C)     Temp Source 01/03/15 0521 Oral     SpO2 01/03/15 0521 97 %     Weight 01/03/15 0521 170 lb (77.111 kg)     Height 01/03/15 0521 5\' 4"  (1.626 m)     Head Cir --      Peak Flow --      Pain Score 01/03/15 0522 7     Pain Loc --      Pain Edu? --      Excl. in GC? --     Constitutional: Alert and oriented. Well appearing and in no acute distress. Eyes: Conjunctivae are normal. PERRL. EOMI. Head: Atraumatic. Nose: No congestion/rhinnorhea. Mouth/Throat: Mucous membranes are moist.  Oropharynx non-erythematous. Neck: No stridor.   Cardiovascular: Normal rate, regular rhythm. Grossly normal heart sounds.  Good peripheral circulation. Respiratory: Normal respiratory effort.  No retractions. Lungs CTAB. Gastrointestinal: Soft and nontender. No distention. No abdominal bruits. No CVA tenderness. Musculoskeletal: No lower extremity tenderness nor edema.  No joint effusions. Chest pain is not reproducible palpation. Neurologic:  Normal speech and language. No gross focal neurologic deficits are appreciated. No gait instability. Skin:  Skin is warm, dry and intact. No rash noted. Psychiatric: Mood and affect are normal. Speech and behavior are normal.  ____________________________________________   LABS (all labs ordered are listed, but only abnormal results are displayed)  Labs Reviewed  BASIC METABOLIC PANEL - Abnormal; Notable for the following:    Sodium 130 (*)    Potassium 3.3 (*)    Chloride 93 (*)    Glucose, Bld 135 (*)    All other components within normal limits  CBC  TROPONIN I  TROPONIN I   ____________________________________________  EKG  ED ECG REPORT I, Arelia Longest, the attending physician, personally viewed and interpreted this ECG.   Date: 01/03/2015  EKG Time: 5:17   Rate: 67  Rhythm: normal EKG, normal sinus rhythm  Axis: Normal axis  Intervals:none  ST&T Change: No ST elevations or depressions. No abnormal T-wave  inversions.  ____________________________________________  RADIOLOGY  No active cardiopulmonary disease on the chest x-ray personally viewed these images. ____________________________________________   PROCEDURES   ____________________________________________   INITIAL IMPRESSION / ASSESSMENT AND PLAN / ED COURSE  Pertinent labs & imaging results that were available during my care of the patient were reviewed by me and considered in my medical decision making (see chart for details).  ----------------------------------------- 7:45 AM on 01/03/2015 -----------------------------------------  Patient is resting comfortably at this time and in no acute distress and without any outward signs of any pain. PERC negative. Patient with some classic features  of cardiac chest pain but with normal EKG as well as initial troponin is negative. Also otherwise active without any chest pain or shortness of breath. We'll repeat the patient's troponin. We'll arrange follow-up with cardiology earlier this week and discharged home to follow-up providing that the patient's second troponin is normal and there is no return of her pain.  Discussed the case with Dr. Darrold Junker who recommends the patient call at 8:30 AM on Tuesday morning for an urgent follow-up appointment.  ----------------------------------------- 9:58 AM on 01/03/2015 -----------------------------------------  Patient continues to rest comfortably. Continues to be chest pain-free. We'll discharge to home. Discussed lab results with patient as well as plan. We'll discharge the patient home. Patient understands the plan is going to comply. Will call Dr. Ann Maki shows his office at 8:30 AM on Tuesday for an urgent follow-up appointment. ____________________________________________   FINAL CLINICAL IMPRESSION(S) / ED DIAGNOSES  Acute chest pain. Initial visit.    Myrna Blazer, MD 01/03/15 [phone]

## 2015-01-15 ENCOUNTER — Other Ambulatory Visit: Payer: Self-pay | Admitting: Family Medicine

## 2015-02-08 ENCOUNTER — Encounter: Payer: Self-pay | Admitting: Family Medicine

## 2015-02-08 ENCOUNTER — Ambulatory Visit (INDEPENDENT_AMBULATORY_CARE_PROVIDER_SITE_OTHER): Payer: BLUE CROSS/BLUE SHIELD | Admitting: Family Medicine

## 2015-02-08 VITALS — BP 134/87 | HR 79 | Temp 98.5°F | Ht 64.0 in | Wt 183.0 lb

## 2015-02-08 DIAGNOSIS — M62830 Muscle spasm of back: Secondary | ICD-10-CM

## 2015-02-08 MED ORDER — CYCLOBENZAPRINE HCL 10 MG PO TABS: 10.0000 mg | ORAL_TABLET | Freq: Three times a day (TID) | ORAL | Status: DC | PRN

## 2015-02-08 NOTE — Progress Notes (Signed)
BP 134/87 mmHg  Pulse 79  Temp(Src) 98.5 F (36.9 C)  Ht  (1.626 m)  Wt 183 lb (83.008 kg)  BMI 31.40 kg/m2  SpO2 100%   Subjective:    Patient ID: Angela Boone, female    DOB: Aug 09, 1965, 49 y.o.   MRN: [id number]  HPI: Angela Boone is a 49 y.o. female  Chief Complaint  Patient presents with  . Back Pain    Patient mowed the grass a week ago,her back started hurting, but has gotten progressively worse of the last week. Most of the weekend she was "on the floor", it is mid-low middle back.   BACK PAIN- mowed the lawn, a little sore, then had a bake sale and twisted funny and her back started hurting Duration: about a week ago Mechanism of injury: unknown Location: low back midline Onset: sudden Severity: moderate Quality: sharp Frequency: constant Radiation: none Aggravating factors: movement, bending and prolonged sitting Alleviating factors: rest, ice, heat, laying and NSAIDs Status: better Treatments attempted: rest, heat and ibuprofen  Relief with NSAIDs?: mild Nighttime pain:  no Paresthesias / decreased sensation:  no Bowel / bladder incontinence:  no Fevers:  no Dysuria / urinary frequency:  no   Relevant past medical, surgical, family and social history reviewed and updated as indicated. Interim medical history since our last visit reviewed. Allergies and medications reviewed and updated.  Review of Systems  Constitutional: Negative.   Respiratory: Negative.   Gastrointestinal: Negative.   Musculoskeletal: Positive for myalgias and back pain. Negative for joint swelling, arthralgias, gait problem, neck pain and neck stiffness.  Psychiatric/Behavioral: Negative.     Per HPI unless specifically indicated above     Objective:    BP 134/87 mmHg  Pulse 79  Temp(Src) 98.5 F (36.9 C)  Ht  (1.626 m)  Wt 183 lb (83.008 kg)  BMI 31.40 kg/m2  SpO2 100%  Wt Readings from Last 3 Encounters:  02/08/15 183 lb (83.008 kg)  01/03/15 170 lb  (77.111 kg)  12/22/14 181 lb 4.8 oz (82.237 kg)    Physical Exam  Constitutional: She is oriented to person, place, and time. She appears well-developed and well-nourished. No distress.  HENT:  Head: Normocephalic and atraumatic.  Right Ear: Hearing normal.  Left Ear: Hearing normal.  Nose: Nose normal.  Eyes: Conjunctivae and lids are normal. Right eye exhibits no discharge. Left eye exhibits no discharge. No scleral icterus.  Cardiovascular: Normal rate, regular rhythm, normal heart sounds and intact distal pulses.  Exam reveals no gallop and no friction rub.   No murmur heard. Pulmonary/Chest: Effort normal and breath sounds normal. No respiratory distress. She has no wheezes. She has no rales. She exhibits no tenderness.  Neurological: She is alert and oriented to person, place, and time.  Skin: Skin is warm, dry and intact. No rash noted. No erythema. No pallor.  Psychiatric: She has a normal mood and affect. Her speech is normal and behavior is normal. Judgment and thought content normal. Cognition and memory are normal.  Nursing note and vitals reviewed. Back Exam:    Inspection:  Normal spinal curvature.  No deformity, ecchymosis, erythema, or lesions     Palpation:     Midline spinal tenderness: no      Paralumbar tenderness: yes Right paraspinal lower lumbar and sacral     Parathoracic tenderness: no      Buttocks tenderness: no     Range of Motion:      Flexion: Fingers  to Knees     Extension:Decreased     Lateral bending:Decreased    Rotation:Decreased    Neuro Exam:Lower extremity DTRs normal & symmetric.  Strength and sensation intact.    Special Tests:      Straight leg raise:negative  Results for orders placed or performed during the hospital encounter of 01/03/15  Basic metabolic panel  Result Value Ref Range   Sodium 130 (L) 135 - 145 mmol/L   Potassium 3.3 (L) 3.5 - 5.1 mmol/L   Chloride 93 (L) 101 - 111 mmol/L   CO2 27 22 - 32 mmol/L   Glucose, Bld 135 (H)  65 - 99 mg/dL   BUN 7 6 - 20 mg/dL   Creatinine, Ser 6.96 0.44 - 1.00 mg/dL   Calcium 8.9 8.9 - 29.5 mg/dL   GFR calc non Af Amer >60 >60 mL/min   GFR calc Af Amer >60 >60 mL/min   Anion gap 10 5 - 15  CBC  Result Value Ref Range   WBC 7.4 3.6 - 11.0 K/uL   RBC 4.44 3.80 - 5.20 MIL/uL   Hemoglobin 14.6 12.0 - 16.0 g/dL   HCT 28.4 13.2 - 44.0 %   MCV 92.8 80.0 - 100.0 fL   MCH 32.8 26.0 - 34.0 pg   MCHC 35.3 32.0 - 36.0 g/dL   RDW 10.2 72.5 - 36.6 %   Platelets 312 150 - 440 K/uL  Troponin I  Result Value Ref Range   Troponin I <0.03 <0.031 ng/mL  Troponin I  Result Value Ref Range   Troponin I <0.03 <0.031 ng/mL      Assessment & Plan:   Problem List Items Addressed This Visit    None    Visit Diagnoses    Muscle spasm of back    -  Primary    Muscle spasm. Continue ibuprofen. Start flexeril. Keep up heat and massages. Exercises given. Continue to monitor. Recheck in 2 weeks if not better.         Follow up plan: Return in about 2 weeks (around 02/22/2015) for follow up back pain.

## 2015-02-08 NOTE — Patient Instructions (Signed)

## 2015-02-22 ENCOUNTER — Ambulatory Visit: Payer: BLUE CROSS/BLUE SHIELD | Admitting: Family Medicine

## 2015-03-10 ENCOUNTER — Encounter: Payer: Self-pay | Admitting: Obstetrics and Gynecology

## 2015-03-10 ENCOUNTER — Encounter (INDEPENDENT_AMBULATORY_CARE_PROVIDER_SITE_OTHER): Payer: Self-pay

## 2015-03-10 ENCOUNTER — Ambulatory Visit (INDEPENDENT_AMBULATORY_CARE_PROVIDER_SITE_OTHER): Payer: BLUE CROSS/BLUE SHIELD | Admitting: Obstetrics and Gynecology

## 2015-03-10 VITALS — BP 149/89 | HR 79 | Ht 64.0 in | Wt 181.5 lb

## 2015-03-10 DIAGNOSIS — Z01419 Encounter for gynecological examination (general) (routine) without abnormal findings: Secondary | ICD-10-CM

## 2015-03-10 NOTE — Patient Instructions (Signed)
Place annual gynecologic exam patient instructions here.

## 2015-03-10 NOTE — Progress Notes (Signed)
  Subjective:     Angela Boone is a 49 y.o. female and is here for a comprehensive physical exam. The patient reports no problems.  Social History   Social History  . Marital Status: Divorced    Spouse Name: N/A  . Number of Children: N/A  . Years of Education: N/A   Occupational History  . Not on file.   Social History Main Topics  . Smoking status: Never Smoker   . Smokeless tobacco: Never Used  . Alcohol Use: Yes     Comment: occas  . Drug Use: No  . Sexual Activity: Yes    Birth Control/ Protection: None   Other Topics Concern  . Not on file   Social History Narrative   Health Maintenance  Topic Date Due  . Janet BerlinETANUS/TDAP  07/21/1984  . HIV Screening  11/10/2015 (Originally 07/21/1980)  . INFLUENZA VACCINE  11/30/2015  . PAP SMEAR  03/14/2017    The following portions of the patient's history were reviewed and updated as appropriate: allergies, current medications, past family history, past medical history, past social history, past surgical history and problem list.  Review of Systems A comprehensive review of systems was negative.   Objective:    General appearance: alert, cooperative and appears stated age Neck: no adenopathy, no carotid bruit, no JVD, supple, symmetrical, trachea midline and thyroid not enlarged, symmetric, no tenderness/mass/nodules Lungs: clear to auscultation bilaterally Breasts: normal appearance, no masses or tenderness Heart: regular rate and rhythm, S1, S2 normal, no murmur, click, rub or gallop Abdomen: soft, non-tender; bowel sounds normal; no masses,  no organomegaly Pelvic: cervix normal in appearance, external genitalia normal, no adnexal masses or tenderness, no cervical motion tenderness, rectovaginal septum normal, uterus normal size, shape, and consistency and vagina normal without discharge    Assessment:    Healthy female exam. OCP user      Plan:  MMG ordered and refills given RTC 1 year or as needed.   See After  Visit Summary for Counseling Recommendations

## 2015-04-14 ENCOUNTER — Ambulatory Visit (INDEPENDENT_AMBULATORY_CARE_PROVIDER_SITE_OTHER): Payer: BLUE CROSS/BLUE SHIELD | Admitting: Obstetrics and Gynecology

## 2015-04-14 VITALS — BP 138/88 | HR 92 | Wt 181.4 lb

## 2015-04-14 DIAGNOSIS — E669 Obesity, unspecified: Secondary | ICD-10-CM

## 2015-04-14 MED ORDER — CYANOCOBALAMIN 1000 MCG/ML IJ SOLN
1000.0000 ug | Freq: Once | INTRAMUSCULAR | Status: AC
  Administered 2015-04-14: 1000 ug via INTRAMUSCULAR

## 2015-04-14 NOTE — Progress Notes (Cosign Needed)
Pt is here for wt, bp check, b-12 inj Denies any s/e, has been doing a boot-camp class q am, pt was very discouraged about not losing any weight, however she said she could button a pair of jeans she hasnt been able to Advised her she is probably losing inches  03/10/15 wt-181

## 2015-04-29 ENCOUNTER — Ambulatory Visit
Admission: RE | Admit: 2015-04-29 | Discharge: 2015-04-29 | Disposition: A | Discharge status: Home / Self Care | Payer: BLUE CROSS/BLUE SHIELD | Source: Ambulatory Visit | Attending: Obstetrics and Gynecology | Admitting: Obstetrics and Gynecology

## 2015-04-29 DIAGNOSIS — Z01419 Encounter for gynecological examination (general) (routine) without abnormal findings: Secondary | ICD-10-CM

## 2015-04-29 DIAGNOSIS — Z1231 Encounter for screening mammogram for malignant neoplasm of breast: Secondary | ICD-10-CM | POA: Insufficient documentation

## 2015-05-05 ENCOUNTER — Other Ambulatory Visit: Payer: Self-pay | Admitting: Family Medicine

## 2015-05-13 ENCOUNTER — Ambulatory Visit: Payer: BLUE CROSS/BLUE SHIELD

## 2015-05-18 ENCOUNTER — Ambulatory Visit: Payer: BLUE CROSS/BLUE SHIELD

## 2015-05-21 ENCOUNTER — Ambulatory Visit: Payer: BLUE CROSS/BLUE SHIELD

## 2015-06-24 ENCOUNTER — Ambulatory Visit: Payer: BLUE CROSS/BLUE SHIELD | Admitting: Family Medicine

## 2015-07-01 ENCOUNTER — Ambulatory Visit (INDEPENDENT_AMBULATORY_CARE_PROVIDER_SITE_OTHER): Payer: BLUE CROSS/BLUE SHIELD | Admitting: Family Medicine

## 2015-07-01 ENCOUNTER — Encounter: Payer: Self-pay | Admitting: Family Medicine

## 2015-07-01 ENCOUNTER — Other Ambulatory Visit: Payer: Self-pay | Admitting: Family Medicine

## 2015-07-01 VITALS — BP 121/81 | HR 93 | Temp 99.0°F | Ht 63.0 in | Wt 181.0 lb

## 2015-07-01 DIAGNOSIS — I10 Essential (primary) hypertension: Secondary | ICD-10-CM

## 2015-07-01 DIAGNOSIS — Z23 Encounter for immunization: Secondary | ICD-10-CM

## 2015-07-01 NOTE — Assessment & Plan Note (Signed)
Under good control. Continue current regimen. Continue to monitor. Recheck 6 months. BMP drawn today.

## 2015-07-01 NOTE — Progress Notes (Signed)
BP 121/81 mmHg  Pulse 93  Temp(Src) 99 F (37.2 C)  Ht  (1.6 m)  Wt 181 lb (82.101 kg)  BMI 32.07 kg/m2  SpO2 99%   Subjective:    Patient ID: Angela Boone, female    DOB: June 25, 1965, 50 y.o.   MRN: [id number]  HPI: Angela Boone is a 50 y.o. female  Chief Complaint  Patient presents with  . Hypertension   HYPERTENSION Hypertension status: controlled  Satisfied with current treatment? yes Duration of hypertension: chronic BP monitoring frequency:  a few times a month BP medication side effects:  no Medication compliance: excellent compliance Aspirin: no Recurrent headaches: no Visual changes: no Palpitations: no Dyspnea: no Chest pain: no Lower extremity edema: no Dizzy/lightheaded: no   Relevant past medical, surgical, family and social history reviewed and updated as indicated. Interim medical history since our last visit reviewed. Allergies and medications reviewed and updated.  Review of Systems  Constitutional: Negative.   Respiratory: Negative.   Cardiovascular: Negative.   Psychiatric/Behavioral: Negative.    Per HPI unless specifically indicated above     Objective:    BP 121/81 mmHg  Pulse 93  Temp(Src) 99 F (37.2 C)  Ht  (1.6 m)  Wt 181 lb (82.101 kg)  BMI 32.07 kg/m2  SpO2 99%  Wt Readings from Last 3 Encounters:  07/01/15 181 lb (82.101 kg)  04/14/15 181 lb 6.4 oz (82.283 kg)  03/10/15 181 lb 8 oz (82.328 kg)    Physical Exam  Constitutional: She is oriented to person, place, and time. She appears well-developed and well-nourished. No distress.  HENT:  Head: Normocephalic and atraumatic.  Right Ear: Hearing normal.  Left Ear: Hearing normal.  Nose: Nose normal.  Eyes: Conjunctivae and lids are normal. Right eye exhibits no discharge. Left eye exhibits no discharge. No scleral icterus.  Cardiovascular: Normal rate, regular rhythm, normal heart sounds and intact distal pulses.  Exam reveals no gallop and no  friction rub.   No murmur heard. Pulmonary/Chest: Effort normal and breath sounds normal. No respiratory distress. She has no wheezes. She has no rales. She exhibits no tenderness.  Musculoskeletal: Normal range of motion.  Neurological: She is alert and oriented to person, place, and time.  Skin: Skin is warm, dry and intact. No rash noted. No erythema. No pallor.  Psychiatric: She has a normal mood and affect. Her speech is normal and behavior is normal. Judgment and thought content normal. Cognition and memory are normal.  Nursing note and vitals reviewed.   Results for orders placed or performed during the hospital encounter of 01/03/15  Basic metabolic panel  Result Value Ref Range   Sodium 130 (L) 135 - 145 mmol/L   Potassium 3.3 (L) 3.5 - 5.1 mmol/L   Chloride 93 (L) 101 - 111 mmol/L   CO2 27 22 - 32 mmol/L   Glucose, Bld 135 (H) 65 - 99 mg/dL   BUN 7 6 - 20 mg/dL   Creatinine, Ser 4.09 0.44 - 1.00 mg/dL   Calcium 8.9 8.9 - 81.1 mg/dL   GFR calc non Af Amer >60 >60 mL/min   GFR calc Af Amer >60 >60 mL/min   Anion gap 10 5 - 15  CBC  Result Value Ref Range   WBC 7.4 3.6 - 11.0 K/uL   RBC 4.44 3.80 - 5.20 MIL/uL   Hemoglobin 14.6 12.0 - 16.0 g/dL   HCT 91.4 78.2 - 95.6 %   MCV 92.8 80.0 - 100.0  fL   MCH 32.8 26.0 - 34.0 pg   MCHC 35.3 32.0 - 36.0 g/dL   RDW 40.9 81.1 - 91.4 %   Platelets 312 150 - 440 K/uL  Troponin I  Result Value Ref Range   Troponin I <0.03 <0.031 ng/mL  Troponin I  Result Value Ref Range   Troponin I <0.03 <0.031 ng/mL      Assessment & Plan:   Problem List Items Addressed This Visit      Cardiovascular and Mediastinum   Hypertension - Primary    Under good control. Continue current regimen. Continue to monitor. Recheck 6 months. BMP drawn today.       Other Visit Diagnoses    Immunization due        Tdap given today    Relevant Orders    Tdap vaccine greater than or equal to 7yo IM (Completed)        Follow up plan: Return in  about 6 months (around 01/01/2016) for Physical.

## 2015-07-02 ENCOUNTER — Encounter: Payer: Self-pay | Admitting: Family Medicine

## 2015-07-02 LAB — BASIC METABOLIC PANEL
BUN / CREAT RATIO: 12 (ref 9–23)
BUN: 11 mg/dL (ref 6–24)
CO2: 20 mmol/L (ref 18–29)
CREATININE: 0.9 mg/dL (ref 0.57–1.00)
Calcium: 9.2 mg/dL (ref 8.7–10.2)
Chloride: 94 mmol/L — ABNORMAL LOW (ref 96–106)
GFR calc Af Amer: 87 mL/min/{1.73_m2} (ref >59)
GFR, EST NON AFRICAN AMERICAN: 75 mL/min/{1.73_m2} (ref >59)
Glucose: 89 mg/dL (ref 65–99)
Potassium: 4.3 mmol/L (ref 3.5–5.2)
SODIUM: 133 mmol/L — AB (ref 134–144)

## 2015-09-24 DIAGNOSIS — M2042 Other hammer toe(s) (acquired), left foot: Secondary | ICD-10-CM | POA: Diagnosis not present

## 2015-09-24 DIAGNOSIS — M7752 Other enthesopathy of left foot: Secondary | ICD-10-CM | POA: Diagnosis not present

## 2015-09-24 DIAGNOSIS — M2011 Hallux valgus (acquired), right foot: Secondary | ICD-10-CM | POA: Diagnosis not present

## 2015-09-24 DIAGNOSIS — M2012 Hallux valgus (acquired), left foot: Secondary | ICD-10-CM | POA: Diagnosis not present

## 2015-09-29 ENCOUNTER — Encounter: Payer: Self-pay | Admitting: Obstetrics and Gynecology

## 2015-09-29 ENCOUNTER — Ambulatory Visit (INDEPENDENT_AMBULATORY_CARE_PROVIDER_SITE_OTHER): Payer: BLUE CROSS/BLUE SHIELD | Admitting: Obstetrics and Gynecology

## 2015-09-29 VITALS — BP 143/82 | HR 73 | Ht 64.0 in | Wt 186.3 lb

## 2015-09-29 DIAGNOSIS — E669 Obesity, unspecified: Secondary | ICD-10-CM | POA: Diagnosis not present

## 2015-09-29 MED ORDER — CYANOCOBALAMIN 1000 MCG/ML IJ SOLN: 1000.0000 ug | Freq: Once | INTRAMUSCULAR | Status: DC

## 2015-10-06 NOTE — Progress Notes (Signed)
SUBJECTIVE:  50 y.o. here for follow-up weight loss visit, previously seen 4 weeks ago. Denies any concerns and feels like medication is working, but needs to improve on exercise.  OBJECTIVE:  BP 143/82 mmHg  Pulse 73  Ht 5\' 4"  (1.626 m)  Wt 186 lb 4.8 oz (84.505 kg)  BMI 31.96 kg/m2  Body mass index is 31.96 kg/(m^2). Patient appears well. ASSESSMENT:  Obesity- responding well to weight loss plan PLAN:  To continue with current medications. B12 103200mcg/ml injection given RTC in 4 weeks as planned  Callaway Hardigree TrappeShambley, CNM

## 2015-10-15 ENCOUNTER — Telehealth: Payer: Self-pay | Admitting: Obstetrics and Gynecology

## 2015-10-15 ENCOUNTER — Other Ambulatory Visit: Payer: Self-pay | Admitting: Obstetrics and Gynecology

## 2015-10-15 ENCOUNTER — Ambulatory Visit
Admission: EM | Admit: 2015-10-15 | Discharge: 2015-10-15 | Disposition: A | Discharge status: Home / Self Care | Payer: BLUE CROSS/BLUE SHIELD | Attending: Family Medicine | Admitting: Family Medicine

## 2015-10-15 DIAGNOSIS — K602 Anal fissure, unspecified: Secondary | ICD-10-CM

## 2015-10-15 DIAGNOSIS — M2011 Hallux valgus (acquired), right foot: Secondary | ICD-10-CM | POA: Diagnosis not present

## 2015-10-15 DIAGNOSIS — M2012 Hallux valgus (acquired), left foot: Secondary | ICD-10-CM | POA: Diagnosis not present

## 2015-10-15 DIAGNOSIS — Z0000 Encounter for general adult medical examination without abnormal findings: Secondary | ICD-10-CM

## 2015-10-15 DIAGNOSIS — R1030 Lower abdominal pain, unspecified: Secondary | ICD-10-CM | POA: Diagnosis not present

## 2015-10-15 DIAGNOSIS — M7752 Other enthesopathy of left foot: Secondary | ICD-10-CM | POA: Diagnosis not present

## 2015-10-15 DIAGNOSIS — M2042 Other hammer toe(s) (acquired), left foot: Secondary | ICD-10-CM | POA: Diagnosis not present

## 2015-10-15 HISTORY — DX: Essential (primary) hypertension: I10

## 2015-10-15 NOTE — ED Provider Notes (Signed)
CSN: [id number]     Arrival date & time 10/15/15  1415 History   First MD Initiated Contact with Patient 10/15/15 1448     Chief Complaint  Patient presents with  . Abdominal Pain   (Consider location/radiation/quality/duration/timing/severity/associated sxs/prior Treatment) HPI Comments: 50 yo female with a c/o lower abdominal pain last night after eating. States was in left lower abdomen. Denies any fevers, chills, vomiting, diarrhea, constipation. However states she did a fleet enema last night to see if it would give her relief. Had several bowel movements and today felt slightly better. States would not have come in to urgent care today, except she went to the bathroom at work and noticed bright red blood on the toilet paper and in the toilet. Denies any anal pain.   The history is provided by the patient.    Past Medical History  Diagnosis Date  . Hypothyroidism   . Trigeminal neuralgia   . Rosacea   . Hypertension    History reviewed. No pertinent past surgical history. Family History  Problem Relation Age of Onset  . Cancer Mother     breast  . Hypertension Mother   . Breast cancer Mother [id number]  . Cancer Father     lung  . Heart disease Father   . Diabetes Son 8    Type 1  . Cancer Maternal Grandmother     breast  . Breast cancer Maternal Grandmother [id number]  . Cancer Maternal Grandfather     colon  . Heart disease Paternal Grandfather   . Hypertension Brother    Social History  Substance Use Topics  . Smoking status: Never Smoker   . Smokeless tobacco: Never Used  . Alcohol Use: Yes     Comment: occas   OB History    No data available     Review of Systems  Allergies  Review of patient's allergies indicates no known allergies.  Home Medications   Prior to Admission medications   Medication Sig Start Date End Date Taking? Authorizing Provider  gabapentin (NEURONTIN) 300 MG capsule Take 600 mg by mouth 2 (two) times daily. Take two capsules in the morning  and three capsules in the evening   Yes Historical Provider, MD  hydrochlorothiazide (HYDRODIURIL) 25 MG tablet TAKE (1) TABLET BY MOUTH EVERY DAY 05/06/15  Yes Megan P Johnson, DO  levothyroxine (SYNTHROID, LEVOTHROID) 88 MCG tablet TAKE 1 TABLET ON AN EMPTY STOMACH WITH A GLASS OF WATER AT LEAST 30 TO 60 MINUTES BEFORE BREAKFAST. 01/15/15  Yes Megan P Johnson, DO  norethindrone (MICRONOR,CAMILA,ERRIN) 0.35 MG tablet Take 1 tablet by mouth daily. 12/14/14  Yes Historical Provider, MD  OXcarbazepine (TRILEPTAL) 150 MG tablet Take 450-600 mg by mouth See admin instructions. Takes 3 tablets (450mg ) orally in the morning and 4 tablets (600mg ) orally at bedtime.   Yes Historical Provider, MD  doxycycline (VIBRAMYCIN) 50 MG capsule Take 50 mg by mouth daily. Routine medication    Historical Provider, MD   Meds Ordered and Administered this Visit  Medications - No data to display  BP 171/95 mmHg  Pulse 90  Temp(Src) 97.9 F (36.6 C) (Oral)  Resp 16  Ht 5\' 4"  (1.626 m)  Wt 180 lb (81.647 kg)  BMI 30.88 kg/m2  SpO2 100% No data found.   Physical Exam  Constitutional: She appears well-developed and well-nourished. No distress.  Cardiovascular: Normal rate, regular rhythm, normal heart sounds and intact distal pulses.   No murmur heard. Pulmonary/Chest: Effort normal and breath  sounds normal. No respiratory distress. She has no wheezes. She has no rales.  Abdominal: Soft. Bowel sounds are normal. She exhibits no distension and no mass. There is tenderness (mild/minimal  left and mid lower quadrant tenderness to palpation; no rebound or guarding). There is no rebound and no guarding.  Positive anal fissures noted (x2); no active bleeding at this time.  Neurological: She is alert.  Skin: Skin is warm and dry. No rash noted. She is not diaphoretic. No erythema.  Nursing note and vitals reviewed.   ED Course  Procedures (including critical care time)  Labs Review Labs Reviewed - No data to  display  Imaging Review No results found.   Visual Acuity Review  Right Eye Distance:   Left Eye Distance:   Bilateral Distance:    Right Eye Near:   Left Eye Near:    Bilateral Near:         MDM   1. Anal fissure   2. Lower abdominal pain    1. diagnosis reviewed with patient 2. rx as per orders above; reviewed possible side effects, interactions, risks and benefits  3. Recommend supportive treatment with sitz baths, increased fluids/clear liquids and advance to BRAT diet, then full diet as tolerated 4. Follow-up prn if symptoms worsen or don't improve    Payton Mccallum, MD 10/15/15 1600

## 2015-10-15 NOTE — ED Notes (Signed)
Patient states last night after dinner she had severe stomach pain on her left lower abdomen. Patient used an Enema for some relief last night and was able to have a bowel movement. She used the restroom 30 mins ago and upon wiping there was bright red blood present.

## 2015-10-15 NOTE — Telephone Encounter (Signed)
[facility] and [facility] I checked and there is no order in for a mammo. Can you put order in?

## 2015-10-15 NOTE — Telephone Encounter (Signed)
Done 10/15/15

## 2015-10-15 NOTE — Discharge Instructions (Signed)
Anal Fissure, Adult An anal fissure is a small tear or crack in the skin around the anus. Bleeding from a fissure usually stops on its own within a few minutes. However, bleeding will often occur again with each bowel movement until the crack heals. CAUSES This condition may be caused by:  Passing large, hard stool (feces).  Frequent diarrhea.  Constipation.  Inflammatory bowel disease (Crohn disease or ulcerative colitis).  Infections.  Anal sex. SYMPTOMS Symptoms of this condition include:  Bleeding from the rectum.  Small amounts of blood seen on your stool, on toilet paper, or in the toilet after a bowel movement.  Painful bowel movements.  Itching or irritation around the anus. DIAGNOSIS A health care provider may diagnose this condition by closely examining the anal area. An anal fissure can usually be seen with careful inspection. In some cases, a rectal exam may be performed, or a short tube (anoscope) may be used to examine the anal canal. TREATMENT Treatment for this condition may include:  Taking steps to avoid constipation. This may include making changes to your diet, such as increasing your intake of fiber or fluid.  Taking fiber supplements. These supplements can soften your stool to help make bowel movements easier. Your health care provider may also prescribe a stool softener if your stool is often hard.  Taking sitz baths. This may help to heal the tear.  Using medicated creams or ointments. These may be prescribed to lessen discomfort. HOME CARE INSTRUCTIONS Eating and Drinking  Avoid foods that may be constipating, such as bananas and dairy products.  Drink enough fluid to keep your urine clear or pale yellow.  Maintain a diet that is high in fruits, whole grains, and vegetables. General Instructions  Keep the anal area as clean and dry as possible.  Take sitz baths as told by your health care provider. Do not use soap in the sitz baths.  Take  over-the-counter and prescription medicines only as told by your health care provider.  Use creams or ointments only as told by your health care provider.  Keep all follow-up visits as told by your health care provider. This is important. SEEK MEDICAL CARE IF:  You have more bleeding.  You have a fever.  You have diarrhea that is mixed with blood.  You continue to have pain.  Your problem is getting worse rather than better.   This information is not intended to replace advice given to you by your health care provider. Make sure you discuss any questions you have with your health care provider.   Document Released: 04/17/2005 Document Revised: 01/06/2015 Document Reviewed: 07/13/2014 Elsevier Interactive Patient Education 2016 Else[employer] Inc[employer]  Abdominal Pain, Adult Many things can cause abdominal pain. Usually, abdominal pain is not caused by a disease and will improve without treatment. It can often be observed and treated at home. Your health care provider will do a physical exam and possibly order blood tests and X-rays to help determine the seriousness of your pain. However, in many cases, more time must pass before a clear cause of the pain can be found. Before that point, your health care provider may not know if you need more testing or further treatment. HOME CARE INSTRUCTIONS Monitor your abdominal pain for any changes. The following actions may help to alleviate any discomfort you are experiencing:  Only take over-the-counter or prescription medicines as directed by your health care provider.  Do not take laxatives unless directed to do so by your health  care provider.  Try a clear liquid diet (broth, tea, or water) as directed by your health care provider. Slowly move to a bland diet as tolerated. SEEK MEDICAL CARE IF:  You have unexplained abdominal pain.  You have abdominal pain associated with nausea or diarrhea.  You have pain when you urinate or have a bowel  movement.  You experience abdominal pain that wakes you in the night.  You have abdominal pain that is worsened or improved by eating food.  You have abdominal pain that is worsened with eating fatty foods.  You have a fever. SEEK IMMEDIATE MEDICAL CARE IF:  Your pain does not go away within 2 hours.  You keep throwing up (vomiting).  Your pain is felt only in portions of the abdomen, such as the right side or the left lower portion of the abdomen.  You pass bloody or black tarry stools. MAKE SURE YOU:  Understand these instructions.  Will watch your condition.  Will get help right away if you are not doing well or get worse.   This information is not intended to replace advice given to you by your health care provider. Make sure you discuss any questions you have with your health care provider.   Document Released: 01/25/2005 Document Revised: 01/06/2015 Document Reviewed: 12/25/2012 Elsevier Interactive Patient Education [employer]

## 2015-11-12 ENCOUNTER — Other Ambulatory Visit: Payer: Self-pay | Admitting: Family Medicine

## 2016-01-17 DIAGNOSIS — M5432 Sciatica, left side: Secondary | ICD-10-CM | POA: Diagnosis not present

## 2016-01-17 DIAGNOSIS — M5136 Other intervertebral disc degeneration, lumbar region: Secondary | ICD-10-CM | POA: Diagnosis not present

## 2016-01-17 DIAGNOSIS — M5431 Sciatica, right side: Secondary | ICD-10-CM | POA: Diagnosis not present

## 2016-01-22 ENCOUNTER — Other Ambulatory Visit: Payer: Self-pay | Admitting: Family Medicine

## 2016-01-24 DIAGNOSIS — M4726 Other spondylosis with radiculopathy, lumbar region: Secondary | ICD-10-CM | POA: Diagnosis not present

## 2016-01-24 DIAGNOSIS — M4727 Other spondylosis with radiculopathy, lumbosacral region: Secondary | ICD-10-CM | POA: Diagnosis not present

## 2016-01-24 DIAGNOSIS — M545 Low back pain: Secondary | ICD-10-CM | POA: Diagnosis not present

## 2016-01-24 DIAGNOSIS — M5116 Intervertebral disc disorders with radiculopathy, lumbar region: Secondary | ICD-10-CM | POA: Diagnosis not present

## 2016-01-24 DIAGNOSIS — M419 Scoliosis, unspecified: Secondary | ICD-10-CM | POA: Diagnosis not present

## 2016-01-24 DIAGNOSIS — M5117 Intervertebral disc disorders with radiculopathy, lumbosacral region: Secondary | ICD-10-CM | POA: Diagnosis not present

## 2016-01-24 DIAGNOSIS — M5136 Other intervertebral disc degeneration, lumbar region: Secondary | ICD-10-CM | POA: Diagnosis not present

## 2016-01-31 DIAGNOSIS — M5136 Other intervertebral disc degeneration, lumbar region: Secondary | ICD-10-CM | POA: Diagnosis not present

## 2016-02-14 DIAGNOSIS — M2042 Other hammer toe(s) (acquired), left foot: Secondary | ICD-10-CM | POA: Diagnosis not present

## 2016-02-14 DIAGNOSIS — M2012 Hallux valgus (acquired), left foot: Secondary | ICD-10-CM | POA: Diagnosis not present

## 2016-02-14 DIAGNOSIS — M7742 Metatarsalgia, left foot: Secondary | ICD-10-CM | POA: Diagnosis not present

## 2016-02-14 DIAGNOSIS — Q6621 Congenital metatarsus primus varus: Secondary | ICD-10-CM | POA: Diagnosis not present

## 2016-02-25 DIAGNOSIS — Q6621 Congenital metatarsus primus varus: Secondary | ICD-10-CM | POA: Diagnosis not present

## 2016-02-25 DIAGNOSIS — M2042 Other hammer toe(s) (acquired), left foot: Secondary | ICD-10-CM | POA: Diagnosis not present

## 2016-02-25 DIAGNOSIS — M2012 Hallux valgus (acquired), left foot: Secondary | ICD-10-CM | POA: Diagnosis not present

## 2016-02-25 DIAGNOSIS — M7742 Metatarsalgia, left foot: Secondary | ICD-10-CM | POA: Diagnosis not present

## 2016-03-14 ENCOUNTER — Ambulatory Visit (INDEPENDENT_AMBULATORY_CARE_PROVIDER_SITE_OTHER): Payer: BLUE CROSS/BLUE SHIELD | Admitting: Family Medicine

## 2016-03-14 ENCOUNTER — Encounter: Payer: Self-pay | Admitting: Family Medicine

## 2016-03-14 VITALS — BP 126/83 | HR 84 | Temp 98.7°F | Ht 64.4 in | Wt 186.3 lb

## 2016-03-14 DIAGNOSIS — E039 Hypothyroidism, unspecified: Secondary | ICD-10-CM | POA: Diagnosis not present

## 2016-03-14 DIAGNOSIS — I10 Essential (primary) hypertension: Secondary | ICD-10-CM | POA: Diagnosis not present

## 2016-03-14 DIAGNOSIS — Z13220 Encounter for screening for lipoid disorders: Secondary | ICD-10-CM | POA: Diagnosis not present

## 2016-03-14 DIAGNOSIS — Z0000 Encounter for general adult medical examination without abnormal findings: Secondary | ICD-10-CM | POA: Diagnosis not present

## 2016-03-14 DIAGNOSIS — G500 Trigeminal neuralgia: Secondary | ICD-10-CM

## 2016-03-14 DIAGNOSIS — E559 Vitamin D deficiency, unspecified: Secondary | ICD-10-CM

## 2016-03-14 LAB — UA/M W/RFLX CULTURE, ROUTINE
BILIRUBIN UA: NEGATIVE
Glucose, UA: NEGATIVE
Leukocytes, UA: NEGATIVE
NITRITE UA: NEGATIVE
PH UA: 7.5 (ref 5.0–7.5)
Protein, UA: NEGATIVE
SPEC GRAV UA: 1.015 (ref 1.005–1.030)
Urobilinogen, Ur: 0.2 mg/dL (ref 0.2–1.0)

## 2016-03-14 LAB — MICROALBUMIN, URINE WAIVED
Creatinine, Urine Waived: 50 mg/dL (ref 10–300)
MICROALB, UR WAIVED: 10 mg/L (ref 0–19)
Microalb/Creat Ratio: <30 mg/g (ref <30)

## 2016-03-14 LAB — MICROSCOPIC EXAMINATION

## 2016-03-14 NOTE — Assessment & Plan Note (Signed)
Stable. Continue current regimen. Continue to monitor. Call with any concerns.  

## 2016-03-14 NOTE — Assessment & Plan Note (Signed)
No problems. Continue to monitor.

## 2016-03-14 NOTE — Patient Instructions (Addendum)

## 2016-03-14 NOTE — Progress Notes (Signed)
BP 126/83 (BP Location: Left Arm, Patient Position: Sitting, Cuff Size: Normal)   Pulse 84   Temp 98.7 F (37.1 C)   Ht 5' 4.4" (1.636 m)   Wt 186 lb 4.8 oz (84.5 kg)   SpO2 97%   BMI 31.58 kg/m    Subjective:    Patient ID: Angela Boone, female    DOB: Aug 12, 1965, 50 y.o.   MRN: [id number]  HPI: Angela Boone is a 50 y.o. female presenting on 03/14/2016 for comprehensive medical examination. Current medical complaints include:  HYPERTENSION Hypertension status: controlled  Satisfied with current treatment? yes Duration of hypertension: chronic BP monitoring frequency:  not checking BP medication side effects:  no Medication compliance: excellent compliance Aspirin: no Recurrent headaches: no Visual changes: no Palpitations: no Dyspnea: no Chest pain: no Lower extremity edema: no Dizzy/lightheaded: no  HYPOTHYROIDISM Thyroid control status:stable Satisfied with current treatment? yes Medication side effects: no Medication compliance: excellent compliance Recent dose adjustment:no Fatigue: no Cold intolerance: no Heat intolerance: no Weight gain: no Weight loss: no Constipation: no Diarrhea/loose stools: no Palpitations: no Lower extremity edema: no Anxiety/depressed mood: no  She currently lives with: husband Menopausal Symptoms: no  Depression Screen done today and results listed below:  [facility]  Decreased Interest 0  Down, Depressed, Hopeless 0  PHQ - 2 Score 0    Past Medical History:  Past Medical History:  Diagnosis Date  . Hypertension   . Hypothyroidism   . Rosacea   . Trigeminal neuralgia     Surgical History:  Past Surgical History:  Procedure Laterality Date  . BUNIONECTOMY    . HAMMER TOE SURGERY Left     Medications:  Current Outpatient Prescriptions on File Prior to Visit  Medication Sig  . doxycycline (VIBRAMYCIN) 50 MG capsule Take 50 mg by mouth daily. Routine medication  .  gabapentin (NEURONTIN) 300 MG capsule Take 600 mg by mouth 2 (two) times daily. Take two capsules in the morning and three capsules in the evening  . hydrochlorothiazide (HYDRODIURIL) 25 MG tablet TAKE (1) TABLET BY MOUTH EVERY DAY  . levothyroxine (SYNTHROID, LEVOTHROID) 88 MCG tablet TAKE 1 TABLET ON AN EMPTY STOMACH WITH A GLASS OF WATER AT LEAST 30 TO 60 MINUTES BEFORE BREAKFAST.  Marland Kitchen. norethindrone (MICRONOR,CAMILA,ERRIN) 0.35 MG tablet Take 1 tablet by mouth daily.  . OXcarbazepine (TRILEPTAL) 150 MG tablet Take 450-600 mg by mouth See admin instructions. Takes 3 tablets (450mg ) orally in the morning and 4 tablets (600mg ) orally at bedtime.   No current facility-administered medications on file prior to visit.     Allergies:  No Known Allergies  Social History:  Social History   Social History  . Marital status: Divorced    Spouse name: N/A  . Number of children: N/A  . Years of education: N/A   Occupational History  . Not on file.   Social History Main Topics  . Smoking status: Never Smoker  . Smokeless tobacco: Never Used  . Alcohol use Yes     Comment: occas  . Drug use: No  . Sexual activity: Yes    Birth control/ protection: None   Other Topics Concern  . Not on file   Social History Narrative  . No narrative on file   History  Smoking Status  . Never Smoker  Smokeless Tobacco  . Never Used   History  Alcohol Use  . Yes    Comment: occas    Family History:  Family History  Problem Relation Age of Onset  . Cancer Mother     breast  . Hypertension Mother   . Breast cancer Mother [id number]  . Cancer Father     lung  . Heart disease Father   . Diabetes Son 8    Type 1  . Cancer Maternal Grandmother     breast  . Breast cancer Maternal Grandmother [id number]  . Cancer Maternal Grandfather     colon  . Heart disease Paternal Grandfather   . Hypertension Brother     Past medical history, surgical history, medications, allergies, family history and social  history reviewed with patient today and changes made to appropriate areas of the chart.   Review of Systems  Constitutional: Negative.   HENT: Negative.   Eyes: Negative.   Respiratory: Negative.   Cardiovascular: Negative.   Gastrointestinal: Negative.   Genitourinary: Negative.   Musculoskeletal: Negative.   Skin: Negative.   Neurological: Negative.   Endo/Heme/Allergies: Negative.   Psychiatric/Behavioral: Negative.     All other ROS negative except what is listed above and in the HPI.      Objective:    BP 126/83 (BP Location: Left Arm, Patient Position: Sitting, Cuff Size: Normal)   Pulse 84   Temp 98.7 F (37.1 C)   Ht 5' 4.4" (1.636 m)   Wt 186 lb 4.8 oz (84.5 kg)   SpO2 97%   BMI 31.58 kg/m   Wt Readings from Last 3 Encounters:  03/14/16 186 lb 4.8 oz (84.5 kg)  10/15/15 180 lb (81.6 kg)  09/29/15 186 lb 4.8 oz (84.5 kg)    Physical Exam  Constitutional: She is oriented to person, place, and time. She appears well-developed and well-nourished. No distress.  HENT:  Head: Normocephalic and atraumatic.  Right Ear: Hearing, tympanic membrane, external ear and ear canal normal.  Left Ear: Hearing, tympanic membrane, external ear and ear canal normal.  Nose: Nose normal.  Mouth/Throat: Uvula is midline, oropharynx is clear and moist and mucous membranes are normal. No oropharyngeal exudate.  Eyes: Conjunctivae, EOM and lids are normal. Pupils are equal, round, and reactive to light. Right eye exhibits no discharge. Left eye exhibits no discharge. No scleral icterus.  Neck: Normal range of motion. Neck supple. No JVD present. No tracheal deviation present. No thyromegaly present.  Cardiovascular: Normal rate, regular rhythm, normal heart sounds and intact distal pulses.  Exam reveals no gallop and no friction rub.   No murmur heard. Pulmonary/Chest: Effort normal and breath sounds normal. No stridor. No respiratory distress. She has no wheezes. She has no rales. She  exhibits no tenderness.  Abdominal: Soft. Bowel sounds are normal. She exhibits no distension and no mass. There is no tenderness. There is no rebound and no guarding.  Genitourinary:  Genitourinary Comments: Breast and GYN exam deferred- done at GYN  Musculoskeletal: Normal range of motion. She exhibits no edema, tenderness or deformity.  Lymphadenopathy:    She has no cervical adenopathy.  Neurological: She is alert and oriented to person, place, and time. She has normal reflexes. She displays normal reflexes. No cranial nerve deficit. She exhibits normal muscle tone. Coordination normal.  Skin: Skin is warm, dry and intact. No rash noted. She is not diaphoretic. No erythema. No pallor.  Psychiatric: She has a normal mood and affect. Her speech is normal and behavior is normal. Judgment and thought content normal. Cognition and memory are normal.  Nursing note and vitals reviewed.   Results for orders  placed or performed in visit on 07/01/15  Basic metabolic panel  Result Value Ref Range   Glucose 89 65 - 99 mg/dL   BUN 11 6 - 24 mg/dL   Creatinine, Ser 1.61 0.57 - 1.00 mg/dL   GFR calc non Af Amer 75 >59 mL/min/1.73   GFR calc Af Amer 87 >59 mL/min/1.73   BUN/Creatinine Ratio 12 9 - 23   Sodium 133 (L) 134 - 144 mmol/L   Potassium 4.3 3.5 - 5.2 mmol/L   Chloride 94 (L) 96 - 106 mmol/L   CO2 20 18 - 29 mmol/L   Calcium 9.2 8.7 - 10.2 mg/dL      Assessment & Plan:   Problem List Items Addressed This Visit      Cardiovascular and Mediastinum   Hypertension    Stable. Continue current regimen. Continue to monitor. Call with any concerns.       Relevant Orders   CBC with Differential/Platelet   Comprehensive metabolic panel   Microalbumin, Urine Waived   UA/M w/rflx Culture, Routine     Endocrine   Hypothyroidism    Stable. Continue current regimen. Continue to monitor. Call with any concerns.       Relevant Orders   Comprehensive metabolic panel   TSH     Nervous  and Auditory   Trigeminal neuralgia    No problems. Continue to monitor.         Other   Vitamin D deficiency    Rechecking labs today. Replace as needed.       Relevant Orders   Comprehensive metabolic panel   VITAMIN D 25 Hydroxy (Vit-D Deficiency, Fractures)    Other Visit Diagnoses    Routine general medical examination at a health care facility    -  Primary   Vaccines up to date. Screening labs checked today. Pap up to date. Mammogram due at end of December. Colonoscopy declined. Continue diet and exercise.    Relevant Orders   CBC with Differential/Platelet   Comprehensive metabolic panel   Lipid Panel w/o Chol/HDL Ratio   Microalbumin, Urine Waived   TSH   UA/M w/rflx Culture, Routine   VITAMIN D 25 Hydroxy (Vit-D Deficiency, Fractures)   Screening for cholesterol level       Labs drawn today.   Relevant Orders   Lipid Panel w/o Chol/HDL Ratio       Follow up plan: Return in about 6 months (around 09/11/2016) for BP follow up.   LABORATORY TESTING:  - Pap smear: up to date  IMMUNIZATIONS:   - Tdap: Tetanus vaccination status reviewed: last tetanus booster within 10 years. - Influenza: Given elsewhere - Pneumovax: Not applicable - Prevnar: Not applicable - Zostavax vaccine: Will check with her insurance  SCREENING: -Mammogram: Done elsewhere  - Colonoscopy: Declined today  - Bone Density: Not applicable   PATIENT COUNSELING:   Advised to take 1 mg of folate supplement per day if capable of pregnancy.   Sexuality: Discussed sexually transmitted diseases, partner selection, use of condoms, avoidance of unintended pregnancy  and contraceptive alternatives.   Advised to avoid cigarette smoking.  I discussed with the patient that most people either abstain from alcohol or drink within safe limits (<=14/week and <=4 drinks/occasion for males, <=7/weeks and <= 3 drinks/occasion for females) and that the risk for alcohol disorders and other health effects  rises proportionally with the number of drinks per week and how often a drinker exceeds daily limits.  Discussed cessation/primary prevention of drug  use and availability of treatment for abuse.   Diet: Encouraged to adjust caloric intake to maintain  or achieve ideal body weight, to reduce intake of dietary saturated fat and total fat, to limit sodium intake by avoiding high sodium foods and not adding table salt, and to maintain adequate dietary potassium and calcium preferably from fresh fruits, vegetables, and low-fat dairy products.    stressed the importance of regular exercise  Injury prevention: Discussed safety belts, safety helmets, smoke detector, smoking near bedding or upholstery.   Dental health: Discussed importance of regular tooth brushing, flossing, and dental visits.    NEXT PREVENTATIVE PHYSICAL DUE IN 1 YEAR. Return in about 6 months (around 09/11/2016) for BP follow up.

## 2016-03-14 NOTE — Assessment & Plan Note (Signed)
Rechecking labs today. Replace as needed.

## 2016-03-15 ENCOUNTER — Encounter: Payer: Self-pay | Admitting: Family Medicine

## 2016-03-15 LAB — COMPREHENSIVE METABOLIC PANEL
ALBUMIN: 4.3 g/dL (ref 3.5–5.5)
ALT: 21 IU/L (ref 0–32)
AST: 16 IU/L (ref 0–40)
Albumin/Globulin Ratio: 1.8 (ref 1.2–2.2)
Alkaline Phosphatase: 60 IU/L (ref 39–117)
BILIRUBIN TOTAL: 0.5 mg/dL (ref 0.0–1.2)
BUN / CREAT RATIO: 14 (ref 9–23)
BUN: 11 mg/dL (ref 6–24)
CALCIUM: 9.6 mg/dL (ref 8.7–10.2)
CO2: 22 mmol/L (ref 18–29)
CREATININE: 0.77 mg/dL (ref 0.57–1.00)
Chloride: 90 mmol/L — ABNORMAL LOW (ref 96–106)
GFR, EST AFRICAN AMERICAN: 104 mL/min/{1.73_m2} (ref >59)
GFR, EST NON AFRICAN AMERICAN: 90 mL/min/{1.73_m2} (ref >59)
GLUCOSE: 72 mg/dL (ref 65–99)
Globulin, Total: 2.4 g/dL (ref 1.5–4.5)
Potassium: 4.2 mmol/L (ref 3.5–5.2)
Sodium: 129 mmol/L — ABNORMAL LOW (ref 134–144)
TOTAL PROTEIN: 6.7 g/dL (ref 6.0–8.5)

## 2016-03-15 LAB — CBC WITH DIFFERENTIAL/PLATELET
BASOS ABS: 0.1 10*3/uL (ref 0.0–0.2)
Basos: 1 %
EOS (ABSOLUTE): 0.3 10*3/uL (ref 0.0–0.4)
Eos: 3 %
HEMOGLOBIN: 15.2 g/dL (ref 11.1–15.9)
Hematocrit: 42.1 % (ref 34.0–46.6)
IMMATURE GRANS (ABS): 0 10*3/uL (ref 0.0–0.1)
IMMATURE GRANULOCYTES: 0 %
LYMPHS: 16 %
Lymphocytes Absolute: 1.3 10*3/uL (ref 0.7–3.1)
MCH: 32.5 pg (ref 26.6–33.0)
MCHC: 36.1 g/dL — ABNORMAL HIGH (ref 31.5–35.7)
MCV: 90 fL (ref 79–97)
MONOCYTES: 9 %
Monocytes Absolute: 0.7 10*3/uL (ref 0.1–0.9)
NEUTROS ABS: 5.6 10*3/uL (ref 1.4–7.0)
NEUTROS PCT: 71 %
Platelets: 379 10*3/uL (ref 150–379)
RBC: 4.68 x10E6/uL (ref 3.77–5.28)
RDW: 13.6 % (ref 12.3–15.4)
WBC: 8.1 10*3/uL (ref 3.4–10.8)

## 2016-03-15 LAB — LIPID PANEL W/O CHOL/HDL RATIO
Cholesterol, Total: 234 mg/dL — ABNORMAL HIGH (ref 100–199)
HDL: 64 mg/dL (ref >39)
LDL CALC: 147 mg/dL — AB (ref 0–99)
Triglycerides: 117 mg/dL (ref 0–149)
VLDL CHOLESTEROL CAL: 23 mg/dL (ref 5–40)

## 2016-03-15 LAB — TSH: TSH: 0.639 u[IU]/mL (ref 0.450–4.500)

## 2016-03-15 LAB — VITAMIN D 25 HYDROXY (VIT D DEFICIENCY, FRACTURES): VIT D 25 HYDROXY: 25.9 ng/mL — AB (ref 30.0–100.0)

## 2016-03-22 DIAGNOSIS — M2012 Hallux valgus (acquired), left foot: Secondary | ICD-10-CM | POA: Diagnosis not present

## 2016-03-22 DIAGNOSIS — M2042 Other hammer toe(s) (acquired), left foot: Secondary | ICD-10-CM | POA: Diagnosis not present

## 2016-03-29 ENCOUNTER — Other Ambulatory Visit: Payer: Self-pay | Admitting: *Deleted

## 2016-03-29 MED ORDER — NORETHINDRONE 0.35 MG PO TABS: 1.0000 | ORAL_TABLET | Freq: Every day | ORAL | 4 refills | Status: DC

## 2016-04-04 DIAGNOSIS — L718 Other rosacea: Secondary | ICD-10-CM | POA: Diagnosis not present

## 2016-04-06 ENCOUNTER — Other Ambulatory Visit: Payer: Self-pay | Admitting: Obstetrics and Gynecology

## 2016-04-06 DIAGNOSIS — Z1231 Encounter for screening mammogram for malignant neoplasm of breast: Secondary | ICD-10-CM

## 2016-04-12 DIAGNOSIS — M2012 Hallux valgus (acquired), left foot: Secondary | ICD-10-CM | POA: Diagnosis not present

## 2016-04-12 DIAGNOSIS — M2042 Other hammer toe(s) (acquired), left foot: Secondary | ICD-10-CM | POA: Diagnosis not present

## 2016-04-27 DIAGNOSIS — M2141 Flat foot [pes planus] (acquired), right foot: Secondary | ICD-10-CM | POA: Diagnosis not present

## 2016-04-27 DIAGNOSIS — M2012 Hallux valgus (acquired), left foot: Secondary | ICD-10-CM | POA: Diagnosis not present

## 2016-04-27 DIAGNOSIS — M2011 Hallux valgus (acquired), right foot: Secondary | ICD-10-CM | POA: Diagnosis not present

## 2016-04-27 DIAGNOSIS — L02612 Cutaneous abscess of left foot: Secondary | ICD-10-CM | POA: Diagnosis not present

## 2016-04-27 DIAGNOSIS — M2142 Flat foot [pes planus] (acquired), left foot: Secondary | ICD-10-CM | POA: Diagnosis not present

## 2016-05-03 ENCOUNTER — Ambulatory Visit
Admission: RE | Admit: 2016-05-03 | Discharge: 2016-05-03 | Disposition: A | Discharge status: Home / Self Care | Payer: BLUE CROSS/BLUE SHIELD | Source: Ambulatory Visit | Attending: Obstetrics and Gynecology | Admitting: Obstetrics and Gynecology

## 2016-05-03 DIAGNOSIS — Z1231 Encounter for screening mammogram for malignant neoplasm of breast: Secondary | ICD-10-CM | POA: Diagnosis not present

## 2016-05-13 ENCOUNTER — Other Ambulatory Visit: Payer: Self-pay | Admitting: Family Medicine

## 2016-06-14 DIAGNOSIS — G500 Trigeminal neuralgia: Secondary | ICD-10-CM | POA: Diagnosis not present

## 2016-06-14 DIAGNOSIS — Z7189 Other specified counseling: Secondary | ICD-10-CM | POA: Diagnosis not present

## 2016-06-14 DIAGNOSIS — Z5181 Encounter for therapeutic drug level monitoring: Secondary | ICD-10-CM | POA: Diagnosis not present

## 2016-06-27 ENCOUNTER — Other Ambulatory Visit: Payer: Self-pay | Admitting: *Deleted

## 2016-06-27 MED ORDER — NORETHINDRONE 0.35 MG PO TABS: 1.0000 | ORAL_TABLET | Freq: Every day | ORAL | 2 refills | Status: DC

## 2016-07-14 ENCOUNTER — Other Ambulatory Visit: Payer: Self-pay | Admitting: Family Medicine

## 2016-09-12 ENCOUNTER — Ambulatory Visit: Payer: BLUE CROSS/BLUE SHIELD | Admitting: Family Medicine

## 2016-09-16 ENCOUNTER — Other Ambulatory Visit: Payer: Self-pay | Admitting: Family Medicine

## 2016-10-05 ENCOUNTER — Encounter: Payer: Self-pay | Admitting: Family Medicine

## 2016-10-05 ENCOUNTER — Ambulatory Visit (INDEPENDENT_AMBULATORY_CARE_PROVIDER_SITE_OTHER): Payer: BLUE CROSS/BLUE SHIELD | Admitting: Family Medicine

## 2016-10-05 VITALS — BP 132/90 | HR 87 | Temp 98.6°F | Wt 195.2 lb

## 2016-10-05 DIAGNOSIS — E785 Hyperlipidemia, unspecified: Secondary | ICD-10-CM | POA: Insufficient documentation

## 2016-10-05 DIAGNOSIS — E782 Mixed hyperlipidemia: Secondary | ICD-10-CM

## 2016-10-05 DIAGNOSIS — E559 Vitamin D deficiency, unspecified: Secondary | ICD-10-CM

## 2016-10-05 DIAGNOSIS — I10 Essential (primary) hypertension: Secondary | ICD-10-CM | POA: Diagnosis not present

## 2016-10-05 DIAGNOSIS — E039 Hypothyroidism, unspecified: Secondary | ICD-10-CM | POA: Diagnosis not present

## 2016-10-05 LAB — MICROALBUMIN, URINE WAIVED
CREATININE, URINE WAIVED: 50 mg/dL (ref 10–300)
MICROALB, UR WAIVED: 10 mg/L (ref 0–19)

## 2016-10-05 MED ORDER — HYDROCHLOROTHIAZIDE 25 MG PO TABS: ORAL_TABLET | ORAL | 1 refills | Status: DC

## 2016-10-05 NOTE — Assessment & Plan Note (Signed)
Has been working on diet and exercise. Rechecking levels today. Await results. Call with any concerns.

## 2016-10-05 NOTE — Assessment & Plan Note (Signed)
Under good control. Rechecking levels today. Continue current regimen. Continue to monitor. Call with any concerns.

## 2016-10-05 NOTE — Progress Notes (Signed)
BP 132/90 (BP Location: Left Arm, Patient Position: Sitting, Cuff Size: Normal)   Pulse 87   Temp 98.6 F (37 C)   Wt 195 lb 3.2 oz (88.5 kg)   SpO2 99%   BMI 33.09 kg/m    Subjective:    Patient ID: Angela Boone, female    DOB: December 01, 1965, 51 y.o.   MRN: [id number]  HPI: Angela Boone is a 51 y.o. female  Chief Complaint  Patient presents with  . Hyperlipidemia  . Hypothyroidism  . Hypertension   HYPERTENSION / HYPERLIPIDEMIA Satisfied with current treatment? yes Duration of hypertension: chronic BP monitoring frequency: not checking BP medication side effects: no Past BP meds: HCTZ Duration of hyperlipidemia: months Cholesterol medication side effects: Not on anything Cholesterol supplements: none Past cholesterol medications: None Medication compliance: excellent compliance Aspirin: no Recent stressors: no Recurrent headaches: no Visual changes: no Palpitations: no Dyspnea: no Chest pain: no Lower extremity edema: no Dizzy/lightheaded: no  HYPOTHYROIDISM Thyroid control status:controlled Satisfied with current treatment? yes Medication side effects: no Medication compliance: excellent compliance Recent dose adjustment:no Fatigue: no Cold intolerance: no Heat intolerance: no Weight gain: no Weight loss: no Constipation: no Diarrhea/loose stools: no Palpitations: no Lower extremity edema: no Anxiety/depressed mood: no  Relevant past medical, surgical, family and social history reviewed and updated as indicated. Interim medical history since our last visit reviewed. Allergies and medications reviewed and updated.  Review of Systems  Constitutional: Positive for diaphoresis. Negative for activity change, appetite change, chills, fatigue, fever and unexpected weight change.  Respiratory: Negative.   Cardiovascular: Negative.   Musculoskeletal: Negative.   Neurological: Negative.   Psychiatric/Behavioral: Negative.     Per HPI unless  specifically indicated above     Objective:    BP 132/90 (BP Location: Left Arm, Patient Position: Sitting, Cuff Size: Normal)   Pulse 87   Temp 98.6 F (37 C)   Wt 195 lb 3.2 oz (88.5 kg)   SpO2 99%   BMI 33.09 kg/m   Wt Readings from Last 3 Encounters:  10/05/16 195 lb 3.2 oz (88.5 kg)  03/14/16 186 lb 4.8 oz (84.5 kg)  10/15/15 180 lb (81.6 kg)    Physical Exam  Constitutional: She is oriented to person, place, and time. She appears well-developed and well-nourished. No distress.  HENT:  Head: Normocephalic and atraumatic.  Right Ear: Hearing normal.  Left Ear: Hearing normal.  Nose: Nose normal.  Eyes: Conjunctivae and lids are normal. Right eye exhibits no discharge. Left eye exhibits no discharge. No scleral icterus.  Cardiovascular: Normal rate, regular rhythm, normal heart sounds and intact distal pulses.  Exam reveals no gallop and no friction rub.   No murmur heard. Pulmonary/Chest: Effort normal and breath sounds normal. No respiratory distress. She has no wheezes. She has no rales. She exhibits no tenderness.  Musculoskeletal: Normal range of motion.  Neurological: She is alert and oriented to person, place, and time.  Skin: Skin is warm, dry and intact. No rash noted. She is not diaphoretic. No erythema. No pallor.  Psychiatric: She has a normal mood and affect. Her speech is normal and behavior is normal. Judgment and thought content normal. Cognition and memory are normal.  Nursing note and vitals reviewed.   Results for orders placed or performed in visit on 03/14/16  Microscopic Examination  Result Value Ref Range   WBC, UA 0-5 0 - 5 /hpf   RBC, UA 0-2 0 - 2 /hpf   Epithelial Cells (non renal)  0-10 0 - 10 /hpf   Bacteria, UA Few (A) None seen/Few  CBC with Differential/Platelet  Result Value Ref Range   WBC 8.1 3.4 - 10.8 x10E3/uL   RBC 4.68 3.77 - 5.28 x10E6/uL   Hemoglobin 15.2 11.1 - 15.9 g/dL   Hematocrit 40.942.1 81.134.0 - 46.6 %   MCV 90 79 - 97 fL    MCH 32.5 26.6 - 33.0 pg   MCHC 36.1 (H) 31.5 - 35.7 g/dL   RDW 91.413.6 78.212.3 - 95.615.4 %   Platelets 379 150 - 379 x10E3/uL   Neutrophils 71 Not Estab. %   Lymphs 16 Not Estab. %   Monocytes 9 Not Estab. %   Eos 3 Not Estab. %   Basos 1 Not Estab. %   Neutrophils Absolute 5.6 1.4 - 7.0 x10E3/uL   Lymphocytes Absolute 1.3 0.7 - 3.1 x10E3/uL   Monocytes Absolute 0.7 0.1 - 0.9 x10E3/uL   EOS (ABSOLUTE) 0.3 0.0 - 0.4 x10E3/uL   Basophils Absolute 0.1 0.0 - 0.2 x10E3/uL   Immature Granulocytes 0 Not Estab. %   Immature Grans (Abs) 0.0 0.0 - 0.1 x10E3/uL  Comprehensive metabolic panel  Result Value Ref Range   Glucose 72 65 - 99 mg/dL   BUN 11 6 - 24 mg/dL   Creatinine, Ser 2.130.77 0.57 - 1.00 mg/dL   GFR calc non Af Amer 90 >59 mL/min/1.73   GFR calc Af Amer 104 >59 mL/min/1.73   BUN/Creatinine Ratio 14 9 - 23   Sodium 129 (L) 134 - 144 mmol/L   Potassium 4.2 3.5 - 5.2 mmol/L   Chloride 90 (L) 96 - 106 mmol/L   CO2 22 18 - 29 mmol/L   Calcium 9.6 8.7 - 10.2 mg/dL   Total Protein 6.7 6.0 - 8.5 g/dL   Albumin 4.3 3.5 - 5.5 g/dL   Globulin, Total 2.4 1.5 - 4.5 g/dL   Albumin/Globulin Ratio 1.8 1.2 - 2.2   Bilirubin Total 0.5 0.0 - 1.2 mg/dL   Alkaline Phosphatase 60 39 - 117 IU/L   AST 16 0 - 40 IU/L   ALT 21 0 - 32 IU/L  Lipid Panel w/o Chol/HDL Ratio  Result Value Ref Range   Cholesterol, Total 234 (H) 100 - 199 mg/dL   Triglycerides 086117 0 - 149 mg/dL   HDL 64 >57>39 mg/dL   VLDL Cholesterol Cal 23 5 - 40 mg/dL   LDL Calculated 846147 (H) 0 - 99 mg/dL  Microalbumin, Urine Waived  Result Value Ref Range   Microalb, Ur Waived 10 0 - 19 mg/L   Creatinine, Urine Waived 50 10 - 300 mg/dL   Microalb/Creat Ratio <30 <30 mg/g  TSH  Result Value Ref Range   TSH 0.639 0.450 - 4.500 uIU/mL  VITAMIN D 25 Hydroxy (Vit-D Deficiency, Fractures)  Result Value Ref Range   Vit D, 25-Hydroxy 25.9 (L) 30.0 - 100.0 ng/mL  UA/M w/rflx Culture, Routine  Result Value Ref Range   Specific Gravity, UA 1.015  1.005 - 1.030   pH, UA 7.5 5.0 - 7.5   Color, UA Yellow Yellow   Appearance Ur Clear Clear   Leukocytes, UA Negative Negative   Protein, UA Negative Negative/Trace   Glucose, UA Negative Negative   Ketones, UA 1+ (A) Negative   RBC, UA Trace (A) Negative   Bilirubin, UA Negative Negative   Urobilinogen, Ur 0.2 0.2 - 1.0 mg/dL   Nitrite, UA Negative Negative   Microscopic Examination See below:  Assessment & Plan:   Problem List Items Addressed This Visit      Cardiovascular and Mediastinum   Hypertension - Primary    Under good control. Continue current regimen. Continue to monitor. Call with any concerns.       Relevant Medications   hydrochlorothiazide (HYDRODIURIL) 25 MG tablet   Other Relevant Orders   Comprehensive metabolic panel   Microalbumin, Urine Waived     Endocrine   Hypothyroidism    Under good control. Rechecking levels today. Continue current regimen. Continue to monitor. Call with any concerns.       Relevant Orders   Comprehensive metabolic panel   TSH     Other   Vitamin D deficiency    Under good control. Rechecking levels today. Continue current regimen. Continue to monitor. Call with any concerns.       Relevant Orders   Comprehensive metabolic panel   VITAMIN D 25 Hydroxy (Vit-D Deficiency, Fractures)   Hyperlipidemia    Has been working on diet and exercise. Rechecking levels today. Await results. Call with any concerns.       Relevant Medications   hydrochlorothiazide (HYDRODIURIL) 25 MG tablet   Other Relevant Orders   Comprehensive metabolic panel   Lipid Panel w/o Chol/HDL Ratio       Follow up plan: Return in about 6 months (around 04/06/2017) for Physical .

## 2016-10-05 NOTE — Patient Instructions (Addendum)
Menopause and Herbal Products What is menopause? Menopause is the normal time of life when menstrual periods decrease in frequency and eventually stop completely. This process can take several years for some women. Menopause is complete when you have had an absence of menstruation for a full year since your last menstrual period. It usually occurs between the ages of 48 and 55. It is not common for menopause to begin before the age of 40. During menopause, your body stops producing the female hormones estrogen and progesterone. Common symptoms associated with this loss of hormones (vasomotor symptoms) are:  Hot flashes.  Hot flushes.  Night sweats.  Other common symptoms and complications of menopause include:  Decrease in sex drive.  Vaginal dryness and thinning of the walls of the vagina. This can make sex painful.  Dryness of the skin and development of wrinkles.  Headaches.  Tiredness.  Irritability.  Memory problems.  Weight gain.  Bladder infections.  Hair growth on the face and chest.  Inability to reproduce offspring (infertility).  Loss of density in the bones (osteoporosis) increasing your risk for breaks (fractures).  Depression.  Hardening and narrowing of the arteries (atherosclerosis). This increases your risk of heart attack and stroke.  What treatment options are available? There are many treatment choices for menopause symptoms. The most common treatment is hormone replacement therapy. Many alternative therapies for menopause are emerging, including the use of herbal products. These supplements can be found in the form of herbs, teas, oils, tinctures, and pills. Common herbal supplements for menopause are made from plants that contain phytoestrogens. Phytoestrogens are compounds that occur naturally in plants and plant products. They act like estrogen in the body. Foods and herbs that contain phytoestrogens include:  Soy.  Flax seeds.  Red  clover.  Ginseng.  What menopause symptoms may be helped if I use herbal products?  Vasomotor symptoms. These may be helped by: ? Soy. Some studies show that soy may have a moderate benefit for hot flashes. ? Black cohosh. There is limited evidence indicating this may be beneficial for hot flashes.  Symptoms that are related to heart and blood vessel disease. These may be helped by soy. Studies have shown that soy can help to lower cholesterol.  Depression. This may be helped by: ? St. John's wort. There is limited evidence that shows this may help mild to moderate depression. ? Black cohosh. There is evidence that this may help depression and mood swings.  Osteoporosis. Soy may help to decrease bone loss that is associated with menopause and may prevent osteoporosis. Limited evidence indicates that red clover may offer some bone loss protection as well. Other herbal products that are commonly used during menopause lack enough evidence to support their use as a replacement for conventional menopause therapies. These products include evening primrose, ginseng, and red clover. What are the cases when herbal products should not be used during menopause? Do not use herbal products during menopause without your health care provider's approval if:  You are taking medicine.  You have a preexisting liver condition.  Are there any risks in my taking herbal products during menopause? If you choose to use herbal products to help with symptoms of menopause, keep in mind that:  Different supplements have different and unmeasured amounts of herbal ingredients.  Herbal products are not regulated the same way that medicines are.  Concentrations of herbs may vary depending on the way they are prepared. For example, the concentration may be different in a pill,   tea, oil, and tincture.  Little is known about the risks of using herbal products, particularly the risks of long-term use.  Some herbal  supplements can be harmful when combined with certain medicines.  Most commonly reported side effects of herbal products are mild. However, if used improperly, many herbal supplements can cause serious problems. Talk to your health care provider before starting any herbal product. If problems develop, stop taking the supplement and let your health care provider know. This information is not intended to replace advice given to you by your health care provider. Make sure you discuss any questions you have with your health care provider. Document Released: 10/04/2007 Document Revised: 03/14/2016 Document Reviewed: 09/30/2013 Elsevier Interactive Patient Education  2017 [employer]  

## 2016-10-05 NOTE — Assessment & Plan Note (Signed)
Under good control. Rechecking levels today. Continue current regimen. Continue to monitor. Call with any concerns.  

## 2016-10-05 NOTE — Assessment & Plan Note (Signed)
Under good control. Continue current regimen. Continue to monitor. Call with any concerns. 

## 2016-10-06 ENCOUNTER — Other Ambulatory Visit: Payer: Self-pay | Admitting: Family Medicine

## 2016-10-06 DIAGNOSIS — E039 Hypothyroidism, unspecified: Secondary | ICD-10-CM

## 2016-10-06 LAB — LIPID PANEL W/O CHOL/HDL RATIO
CHOLESTEROL TOTAL: 217 mg/dL — AB (ref 100–199)
HDL: 64 mg/dL (ref >39)
LDL CALC: 120 mg/dL — AB (ref 0–99)
TRIGLYCERIDES: 166 mg/dL — AB (ref 0–149)
VLDL CHOLESTEROL CAL: 33 mg/dL (ref 5–40)

## 2016-10-06 LAB — COMPREHENSIVE METABOLIC PANEL
ALK PHOS: 62 IU/L (ref 39–117)
ALT: 20 IU/L (ref 0–32)
AST: 22 IU/L (ref 0–40)
Albumin/Globulin Ratio: 1.9 (ref 1.2–2.2)
Albumin: 4.5 g/dL (ref 3.5–5.5)
BILIRUBIN TOTAL: 0.3 mg/dL (ref 0.0–1.2)
BUN/Creatinine Ratio: 16 (ref 9–23)
BUN: 13 mg/dL (ref 6–24)
CHLORIDE: 95 mmol/L — AB (ref 96–106)
CO2: 23 mmol/L (ref 18–29)
CREATININE: 0.79 mg/dL (ref 0.57–1.00)
Calcium: 9.5 mg/dL (ref 8.7–10.2)
GFR calc Af Amer: 100 mL/min/{1.73_m2} (ref >59)
GFR calc non Af Amer: 87 mL/min/{1.73_m2} (ref >59)
GLUCOSE: 100 mg/dL — AB (ref 65–99)
Globulin, Total: 2.4 g/dL (ref 1.5–4.5)
Potassium: 3.8 mmol/L (ref 3.5–5.2)
Sodium: 134 mmol/L (ref 134–144)
Total Protein: 6.9 g/dL (ref 6.0–8.5)

## 2016-10-06 LAB — VITAMIN D 25 HYDROXY (VIT D DEFICIENCY, FRACTURES): Vit D, 25-Hydroxy: 25.8 ng/mL — ABNORMAL LOW (ref 30.0–100.0)

## 2016-10-06 LAB — TSH: TSH: 0.369 u[IU]/mL — ABNORMAL LOW (ref 0.450–4.500)

## 2016-10-06 MED ORDER — LEVOTHYROXINE SODIUM 75 MCG PO TABS: 75.0000 ug | ORAL_TABLET | Freq: Every day | ORAL | 1 refills | Status: DC

## 2016-10-16 DIAGNOSIS — H43812 Vitreous degeneration, left eye: Secondary | ICD-10-CM | POA: Diagnosis not present

## 2016-11-15 DIAGNOSIS — H43812 Vitreous degeneration, left eye: Secondary | ICD-10-CM | POA: Diagnosis not present

## 2016-11-23 DIAGNOSIS — L820 Inflamed seborrheic keratosis: Secondary | ICD-10-CM | POA: Diagnosis not present

## 2016-11-23 DIAGNOSIS — L821 Other seborrheic keratosis: Secondary | ICD-10-CM | POA: Diagnosis not present

## 2016-11-23 DIAGNOSIS — D485 Neoplasm of uncertain behavior of skin: Secondary | ICD-10-CM | POA: Diagnosis not present

## 2016-12-07 ENCOUNTER — Other Ambulatory Visit: Payer: Self-pay | Admitting: Family Medicine

## 2016-12-07 NOTE — Telephone Encounter (Signed)
Needs lab draw

## 2016-12-07 NOTE — Telephone Encounter (Signed)
Order faxed to patient to have drawn at her work.

## 2016-12-07 NOTE — Telephone Encounter (Signed)
Left a message for patient to return my call. 

## 2016-12-18 ENCOUNTER — Other Ambulatory Visit: Payer: Self-pay | Admitting: Family Medicine

## 2016-12-18 MED ORDER — LEVOTHYROXINE SODIUM 75 MCG PO TABS: 75.0000 ug | ORAL_TABLET | Freq: Every day | ORAL | 4 refills | Status: DC

## 2016-12-22 ENCOUNTER — Encounter: Payer: Self-pay | Admitting: *Deleted

## 2016-12-22 ENCOUNTER — Ambulatory Visit (INDEPENDENT_AMBULATORY_CARE_PROVIDER_SITE_OTHER): Payer: BLUE CROSS/BLUE SHIELD

## 2016-12-22 ENCOUNTER — Ambulatory Visit
Admission: EM | Admit: 2016-12-22 | Discharge: 2016-12-22 | Disposition: A | Discharge status: Home / Self Care | Payer: BLUE CROSS/BLUE SHIELD | Attending: Family Medicine | Admitting: Family Medicine

## 2016-12-22 DIAGNOSIS — S61259A Open bite of unspecified finger without damage to nail, initial encounter: Secondary | ICD-10-CM

## 2016-12-22 DIAGNOSIS — S61451A Open bite of right hand, initial encounter: Secondary | ICD-10-CM | POA: Diagnosis not present

## 2016-12-22 DIAGNOSIS — S61212A Laceration without foreign body of right middle finger without damage to nail, initial encounter: Secondary | ICD-10-CM

## 2016-12-22 DIAGNOSIS — W540XXA Bitten by dog, initial encounter: Secondary | ICD-10-CM

## 2016-12-22 DIAGNOSIS — M7742 Metatarsalgia, left foot: Secondary | ICD-10-CM | POA: Diagnosis not present

## 2016-12-22 DIAGNOSIS — Q6621 Congenital metatarsus primus varus: Secondary | ICD-10-CM | POA: Diagnosis not present

## 2016-12-22 DIAGNOSIS — M7752 Other enthesopathy of left foot: Secondary | ICD-10-CM | POA: Diagnosis not present

## 2016-12-22 MED ORDER — AMOXICILLIN-POT CLAVULANATE 875-125 MG PO TABS: 1.0000 | ORAL_TABLET | Freq: Two times a day (BID) | ORAL | 0 refills | Status: DC

## 2016-12-22 MED ORDER — MUPIROCIN 2 % EX OINT: TOPICAL_OINTMENT | CUTANEOUS | 0 refills | Status: DC

## 2016-12-22 NOTE — ED Provider Notes (Signed)
[facility] ____________________________________________  Time seen: Approximately 6:59 PM  I have reviewed the triage vital signs and the nursing notes.   HISTORY  Chief Complaint Animal Bite  HPI Angela Boone is a 51 y.o. female  presenting for evaluation of the dog bite to right hand that occurred just prior to arrival. Patient states that she was outside playing with her dog, and in the process of trying to pull tennis ball out of his mouth he bit 1 to her right hand. Reports dog is an 78-year-old [place]. Patient reports dog is up-to-date on rabies immunizations with most recent immunization this year. Patient reports her last tetanus immunization was within the last 3 years. Presenting for evaluation of laceration to right second third fourth and fifth fingers. States minimal pain at this time. States did clean the area prior to arrival. No other alleviating measures taken. Denies other complaints. Reports otherwise feels well. Denies fall to ground.   Denies recent sickness. Denies recent antibiotic use.    Past Medical History:  Diagnosis Date  . Hypertension   . Hypothyroidism   . Rosacea   . Trigeminal neuralgia     Patient Active Problem List   Diagnosis Date Noted  . Hyperlipidemia 10/05/2016  . Vitamin D deficiency 12/22/2014  . Hypothyroidism 11/10/2014  . Benign hypertensive renal disease 11/10/2014  . Trigeminal neuralgia 11/10/2014    Past Surgical History:  Procedure Laterality Date  . BUNIONECTOMY    . HAMMER TOE SURGERY Left      No current facility-administered medications for this encounter.   Current Outpatient Prescriptions:  .  hydrochlorothiazide (HYDRODIURIL) 25 MG tablet, TAKE ONE (1) TABLET BY MOUTH ONCE DAILY, Disp: 90 tablet, Rfl: 1 .  levothyroxine (SYNTHROID, LEVOTHROID) 75 MCG tablet, Take 1 tablet (75 mcg total) by mouth daily before breakfast., Disp: 90 tablet, Rfl: 4 .  norethindrone  (MICRONOR,CAMILA,ERRIN) 0.35 MG tablet, Take 1 tablet (0.35 mg total) by mouth daily., Disp: 3 Package, Rfl: 2 .  OXcarbazepine (TRILEPTAL) 150 MG tablet, Take 450-600 mg by mouth See admin instructions. Takes 3 tablets (450mg ) orally in the morning and 4 tablets (600mg ) orally at bedtime., Disp: , Rfl:  .  amoxicillin-clavulanate (AUGMENTIN) 875-125 MG tablet, Take 1 tablet by mouth every 12 (twelve) hours., Disp: 20 tablet, Rfl: 0 .  mupirocin ointment (BACTROBAN) 2 %, Apply three times a day for 5 days., Disp: 22 g, Rfl: 0  Allergies Patient has no known allergies.  Family History  Problem Relation Age of Onset  . Cancer Mother        breast  . Hypertension Mother   . Breast cancer Mother 79  . Cancer Father        lung  . Heart disease Father   . Diabetes Son 8       Type 1  . Cancer Maternal Grandmother        breast  . Breast cancer Maternal Grandmother 67  . Cancer Maternal Grandfather        colon  . Heart disease Paternal Grandfather   . Hypertension Brother     Social History Social History  Substance Use Topics  . Smoking status: Never Smoker  . Smokeless tobacco: Never Used  . Alcohol use Yes     Comment: occas    Review of Systems Constitutional: No fever/chills Cardiovascular: Denies chest pain. Respiratory: Denies shortness of breath. Gastrointestinal: No abdominal pain.  Musculoskeletal: Negative for back pain. Skin: As above.   ____________________________________________  PHYSICAL EXAM:  VITAL SIGNS: ED Triage Vitals  Enc Vitals Group     BP 12/22/16 1829 131/87     Pulse Rate 12/22/16 1829 82     Resp 12/22/16 1829 16     Temp 12/22/16 1829 98.8 F (37.1 C)     Temp Source 12/22/16 1829 Oral     SpO2 12/22/16 1829 100 %     Weight 12/22/16 1830 180 lb (81.6 kg)     Height 12/22/16 1830 5\' 4"  (1.626 m)     Head Circumference --      Peak Flow --      Pain Score --      Pain Loc --      Pain Edu? --      Excl. in GC? --      Constitutional: Alert and oriented. Well appearing and in no acute distress. Cardiovascular: Normal rate, regular rhythm. Grossly normal heart sounds.  Good peripheral circulation. Respiratory: Normal respiratory effort without tachypnea nor retractions. Breath sounds are clear and equal bilaterally. No wheezes, rales, rhonchi. Musculoskeletal: Steady gait. See skin below. Neurologic:  Normal speech and language. No gross focal neurologic deficits are appreciated. Speech is normal. No gait instability.  Skin:  Skin is warm, dry. Except: Right dorsal proximal second phalanx and proximal fifth phalanx single puncture wound present, nontender, no bleeding, no erythema, full range of motion. Right palmar aspect proximal phalanx of the fourth finger single puncture noted, no active bleeding, no erythema, nontender, full range of motion. Third middle phalanx palmar aspect approximately 1.5 cm jagged edged laceration present with minimal active bleeding, well approximated, mild tenderness to direct palpation, no point bony tenderness, normal distal capillary refill, slightly decreased distal sensation but full range of motion present, normal distal capillary refill time, good resisted flexion and extension, no motor or tendon deficits noted. Right hand otherwise nontender. Psychiatric: Mood and affect are normal. Speech and behavior are normal. Patient exhibits appropriate insight and judgment   ___________________________________________   LABS (all labs ordered are listed, but only abnormal results are displayed)  Labs Reviewed - No data to display ____________________________________________  RADIOLOGY  Dg Finger Middle Right  Result Date: 12/22/2016 CLINICAL DATA:  Long finger laceration from dog bite today. EXAM: RIGHT MIDDLE FINGER 2+V COMPARISON:  None. FINDINGS: The mineralization and alignment are normal. There is no evidence of acute fracture or dislocation. Mild soft tissue  swelling/irregularity without foreign body or definite soft tissue emphysema. IMPRESSION: No acute osseous findings.  Probable soft tissue injury. Electronically Signed   By: Carey Bullocks M.D.   On: 12/22/2016 19:19   ____________________________________________   PROCEDURES Procedures   Procedure(s) performed:  Procedure explained and verbal consent obtained. Consent: Verbal consent obtained. Written consent not obtained. Risks and benefits: risks, benefits and alternatives were discussed Patient identity confirmed: verbally with patient and hospital-assigned identification number  Consent given by: patient   Laceration Repair Location: right 2, 4, 5 digits, punctures, no repair indicated Location: right 3 digit Length: 1.5 cm Foreign bodies: no foreign bodies visualized Tendon involvement: none Nerve involvement: none Preparation: Patient was prepped and draped in the usual sterile fashion. Anesthesia: none Irrigation solution: saline and betadine Irrigation method: jet lavage Amount of cleaning: copious No suture indicated. X 1 steri strip applied Approximation: loose Patient tolerate well. Wound well approximated post repair.  Antibiotic ointment and dressing applied.  Wound care instructions provided.  Observe for any signs of infection or other problems.   Finger splint applied  to right 3rd finger.    INITIAL IMPRESSION / ASSESSMENT AND PLAN / ED COURSE  Pertinent labs & imaging results that were available during my care of the patient were reviewed by me and considered in my medical decision making (see chart for details).  Appearing patient. No acute distress.Dog bite to right hand from her dog due to provoked injury when playing with animal. Patient's tetanus immunization is up-to-date. Dog's rabies immunization up-to-date. Kim RN called and reported bite to animal control. Discussed as laceration is well approximated, cleaned and irrigated wound copiously, but no  suture indicated due to increased risk of infection. Discussed in detail with patient regarding evaluation by x-ray, patient declines x-ray of full hand and consents to x-ray of third digit. X-ray reviewed, no bony abnormality or foreign bodies noted. Will prophylactically place patient on oral Augmentin, topical Bactroban and strict follow-up and return parameters noted. Discussed cleaning and wound care. Discussed indication, risks and benefits of medications with patient.  Discussed follow up with Primary care physician this week as needed. Discussed follow up and return parameters including no resolution or any worsening concerns. Patient verbalized understanding and agreed to plan.   ____________________________________________   FINAL CLINICAL IMPRESSION(S) / ED DIAGNOSES  Final diagnoses:  Dog bite of multiple sites of right hand and fingers, initial encounter  Laceration of right middle finger without foreign body without damage to nail, initial encounter     Discharge Medication List as of 12/22/2016  7:45 PM    START taking these medications   Details  amoxicillin-clavulanate (AUGMENTIN) 875-125 MG tablet Take 1 tablet by mouth every 12 (twelve) hours., Starting Fri 12/22/2016, Normal    mupirocin ointment (BACTROBAN) 2 % Apply three times a day for 5 days., Normal        Note: This dictation was prepared with Dragon dictation along with smaller phrase technology. Any transcriptional errors that result from this process are unintentional.         Renford Dills, NP 12/22/16 2001

## 2016-12-22 NOTE — Discharge Instructions (Signed)
Take medication as prescribed. Keep area clean as discussed. Drink plenty of fluids. Use finger splint as discussed.  Follow up with your primary care physician this week as needed. Return to Urgent care for new or worsening concerns.

## 2016-12-22 NOTE — ED Triage Notes (Signed)
Pt was playing ball with her dog and the dog accidentally bit her right hand. Now c/o small laceration to right middle finger and abrasions to index and fifth fingers. Dog is up to date on all immunizations.

## 2017-03-05 ENCOUNTER — Encounter: Payer: Self-pay | Admitting: Family Medicine

## 2017-03-28 ENCOUNTER — Other Ambulatory Visit: Payer: Self-pay | Admitting: Obstetrics and Gynecology

## 2017-03-28 ENCOUNTER — Other Ambulatory Visit: Payer: Self-pay | Admitting: Family Medicine

## 2017-03-29 ENCOUNTER — Encounter: Payer: BLUE CROSS/BLUE SHIELD | Admitting: Family Medicine

## 2017-04-10 ENCOUNTER — Encounter: Payer: BLUE CROSS/BLUE SHIELD | Admitting: Family Medicine

## 2017-04-27 ENCOUNTER — Other Ambulatory Visit: Payer: Self-pay | Admitting: Obstetrics and Gynecology

## 2017-05-22 ENCOUNTER — Other Ambulatory Visit: Payer: Self-pay | Admitting: *Deleted

## 2017-05-22 MED ORDER — NORETHINDRONE 0.35 MG PO TABS: 1.0000 | ORAL_TABLET | Freq: Every day | ORAL | 2 refills | Status: DC

## 2017-06-04 ENCOUNTER — Other Ambulatory Visit: Payer: Self-pay | Admitting: Family Medicine

## 2017-06-04 DIAGNOSIS — Z1231 Encounter for screening mammogram for malignant neoplasm of breast: Secondary | ICD-10-CM

## 2017-06-11 ENCOUNTER — Ambulatory Visit
Admission: RE | Admit: 2017-06-11 | Discharge: 2017-06-11 | Disposition: A | Discharge status: Home / Self Care | Payer: BLUE CROSS/BLUE SHIELD | Source: Ambulatory Visit | Attending: Family Medicine | Admitting: Family Medicine

## 2017-06-11 DIAGNOSIS — Z1231 Encounter for screening mammogram for malignant neoplasm of breast: Secondary | ICD-10-CM

## 2017-06-25 ENCOUNTER — Ambulatory Visit (INDEPENDENT_AMBULATORY_CARE_PROVIDER_SITE_OTHER): Payer: BLUE CROSS/BLUE SHIELD | Admitting: Family Medicine

## 2017-06-25 ENCOUNTER — Encounter: Payer: Self-pay | Admitting: Family Medicine

## 2017-06-25 VITALS — BP 123/77 | HR 80 | Ht 64.0 in | Wt 207.3 lb

## 2017-06-25 DIAGNOSIS — R202 Paresthesia of skin: Secondary | ICD-10-CM

## 2017-06-25 DIAGNOSIS — I129 Hypertensive chronic kidney disease with stage 1 through stage 4 chronic kidney disease, or unspecified chronic kidney disease: Secondary | ICD-10-CM

## 2017-06-25 DIAGNOSIS — Z1211 Encounter for screening for malignant neoplasm of colon: Secondary | ICD-10-CM

## 2017-06-25 DIAGNOSIS — E039 Hypothyroidism, unspecified: Secondary | ICD-10-CM | POA: Diagnosis not present

## 2017-06-25 DIAGNOSIS — Z0001 Encounter for general adult medical examination with abnormal findings: Secondary | ICD-10-CM | POA: Diagnosis not present

## 2017-06-25 DIAGNOSIS — Z0000 Encounter for general adult medical examination without abnormal findings: Secondary | ICD-10-CM

## 2017-06-25 DIAGNOSIS — E782 Mixed hyperlipidemia: Secondary | ICD-10-CM | POA: Diagnosis not present

## 2017-06-25 DIAGNOSIS — E559 Vitamin D deficiency, unspecified: Secondary | ICD-10-CM | POA: Diagnosis not present

## 2017-06-25 MED ORDER — HYDROCHLOROTHIAZIDE 25 MG PO TABS: 25.0000 mg | ORAL_TABLET | Freq: Every day | ORAL | 1 refills | Status: DC

## 2017-06-25 NOTE — Progress Notes (Signed)
BP 123/77 (BP Location: Left Arm, Patient Position: Sitting, Cuff Size: Large)   Pulse 80   Ht 5\' 4"  (1.626 m)   Wt 207 lb 5 oz (94 kg)   SpO2 99%   BMI 35.59 kg/m    Subjective:    Patient ID: Angela Boone, female    DOB: [birth year], 52 y.o.   MRN: [id number]  HPI: CORTLYNN HOLLINSWORTH is a 52 y.o. female presenting on 06/25/2017 for comprehensive medical examination. Current medical complaints include:  NUMBNESS Duration: 8 weeks Onset: gradual Location: tips of the middle 3 finger, and up the arm, L arm only Bilateral: no Symmetric: no Decreased sensation: no  Weakness: no Pain: yes- aching that goes up her arm Quality:  tingly Severity: mild  Frequency: intermittent Trauma: no Recent illness: no Diabetes: no Thyroid disease: yes  HIV: no  Alcoholism: no  Spinal cord injury: no Alleviating factors: moving it Aggravating factors: sleeping- sleeps on that hand Status: stable  HYPERTENSION / HYPERLIPIDEMIA Satisfied with current treatment? yes Duration of hypertension: chronic BP monitoring frequency: not checking BP medication side effects: no Past BP meds: hctz Duration of hyperlipidemia: chronic Cholesterol medication side effects: Not on anything Cholesterol supplements: none Past cholesterol medications: none Medication compliance: excellent compliance Aspirin: no Recent stressors: no Recurrent headaches: no Visual changes: no Palpitations: no Dyspnea: no Chest pain: no Lower extremity edema: no Dizzy/lightheaded: no   HYPOTHYROIDISM Thyroid control status:controlled Satisfied with current treatment? yes Medication side effects: no Medication compliance: excellent compliance Etiology of hypothyroidism:  Recent dose adjustment:no Fatigue: no Cold intolerance: no Heat intolerance: no Weight gain: no Weight loss: no Constipation: no Diarrhea/loose stools: no Palpitations: no Lower extremity edema: no Anxiety/depressed mood: no  She  currently lives with: family Menopausal Symptoms: yes  Depression Screen done today and results listed below:  Depression screen [facility] 2/9 06/25/2017 03/14/2016  Decreased Interest 0 0  Down, Depressed, Hopeless 0 0  PHQ - 2 Score 0 0  Altered sleeping 0 -  Tired, decreased energy 0 -  Change in appetite 0 -  Feeling bad or failure about yourself  0 -  Trouble concentrating 0 -  Moving slowly or fidgety/restless 0 -  Suicidal thoughts 0 -  PHQ-9 Score 0 -  Difficult doing work/chores Not difficult at all -     Past Medical History:  Past Medical History:  Diagnosis Date  . Hypertension   . Hypothyroidism   . Rosacea   . Trigeminal neuralgia     Surgical History:  Past Surgical History:  Procedure Laterality Date  . BUNIONECTOMY    . HAMMER TOE SURGERY Left     Medications:  Current Outpatient Medications on File Prior to Visit  Medication Sig  . levothyroxine (SYNTHROID, LEVOTHROID) 75 MCG tablet Take 1 tablet (75 mcg total) by mouth daily before breakfast.  . norethindrone (CAMILA) 0.35 MG tablet Take 1 tablet (0.35 mg total) by mouth daily.  . OXcarbazepine (TRILEPTAL) 150 MG tablet Take 450-600 mg by mouth See admin instructions. Takes 3 tablets (450mg ) orally in the morning and 4 tablets (600mg ) orally at bedtime.   No current facility-administered medications on file prior to visit.     Allergies:  No Known Allergies  Social History:  Social History   Socioeconomic History  . Marital status: Divorced    Spouse name: Not on file  . Number of children: Not on file  . Years of education: Not on file  . Highest education level: Not on  file  Social Needs  . Financial resource strain: Not on file  . Food insecurity - worry: Not on file  . Food insecurity - inability: Not on file  . Transportation needs - medical: Not on file  . Transportation needs - non-medical: Not on file  Occupational History  . Not on file  Tobacco Use  . Smoking status: Never  Smoker  . Smokeless tobacco: Never Used  Substance and Sexual Activity  . Alcohol use: Yes    Comment: occas  . Drug use: No  . Sexual activity: Yes    Birth control/protection: None  Other Topics Concern  . Not on file  Social History Narrative  . Not on file   Social History   Tobacco Use  Smoking Status Never Smoker  Smokeless Tobacco Never Used   Social History   Substance and Sexual Activity  Alcohol Use Yes   Comment: occas    Family History:  Family History  Problem Relation Age of Onset  . Cancer Mother        breast  . Hypertension Mother   . Breast cancer Mother 31  . Cancer Father        lung  . Heart disease Father   . Diabetes Son 8       Type 1  . Cancer Maternal Grandmother        breast  . Breast cancer Maternal Grandmother 71  . Cancer Maternal Grandfather        colon  . Heart disease Paternal Grandfather   . Hypertension Brother     Past medical history, surgical history, medications, allergies, family history and social history reviewed with patient today and changes made to appropriate areas of the chart.   Review of Systems  Constitutional: Positive for diaphoresis. Negative for chills, fever, malaise/fatigue and weight loss.  HENT: Negative.   Eyes: Negative.   Respiratory: Negative.   Cardiovascular: Negative.   Gastrointestinal: Negative.   Genitourinary: Negative.   Musculoskeletal: Negative.   Skin: Negative.   Neurological: Positive for tingling. Negative for dizziness, tremors, sensory change, speech change, focal weakness, seizures, loss of consciousness, weakness and headaches.  Endo/Heme/Allergies: Negative.   Psychiatric/Behavioral: Negative.     All other ROS negative except what is listed above and in the HPI.      Objective:    BP 123/77 (BP Location: Left Arm, Patient Position: Sitting, Cuff Size: Large)   Pulse 80   Ht 5\' 4"  (1.626 m)   Wt 207 lb 5 oz (94 kg)   SpO2 99%   BMI 35.59 kg/m   Wt Readings  from Last 3 Encounters:  06/25/17 207 lb 5 oz (94 kg)  12/22/16 180 lb (81.6 kg)  10/05/16 195 lb 3.2 oz (88.5 kg)    Physical Exam  Constitutional: She is oriented to person, place, and time. She appears well-developed and well-nourished. No distress.  HENT:  Head: Normocephalic and atraumatic.  Right Ear: Hearing, tympanic membrane, external ear and ear canal normal.  Left Ear: Hearing, tympanic membrane, external ear and ear canal normal.  Nose: Nose normal.  Mouth/Throat: Uvula is midline, oropharynx is clear and moist and mucous membranes are normal. No oropharyngeal exudate.  Eyes: Conjunctivae, EOM and lids are normal. Pupils are equal, round, and reactive to light. Right eye exhibits no discharge. Left eye exhibits no discharge. No scleral icterus.  Neck: Normal range of motion. Neck supple. No JVD present. No tracheal deviation present. No thyromegaly present.  Cardiovascular: Normal  rate, regular rhythm, normal heart sounds and intact distal pulses. Exam reveals no gallop and no friction rub.  No murmur heard. Pulmonary/Chest: Effort normal and breath sounds normal. No stridor. No respiratory distress. She has no wheezes. She has no rales. She exhibits no tenderness.  Abdominal: Soft. Bowel sounds are normal. She exhibits no distension and no mass. There is no tenderness. There is no rebound and no guarding.  Genitourinary:  Genitourinary Comments: Breast and pelvic exams deferred- done at GYN  Musculoskeletal: Normal range of motion. She exhibits no edema, tenderness or deformity.  Lymphadenopathy:    She has no cervical adenopathy.  Neurological: She is alert and oriented to person, place, and time. She has normal reflexes. She displays normal reflexes. No cranial nerve deficit. She exhibits normal muscle tone. Coordination normal.  Negative Phalen's and tinel's bilaterally  Skin: Skin is warm, dry and intact. No rash noted. She is not diaphoretic. No erythema. No pallor.    Psychiatric: She has a normal mood and affect. Her speech is normal and behavior is normal. Judgment and thought content normal. Cognition and memory are normal.  Nursing note and vitals reviewed.   Results for orders placed or performed in visit on 10/05/16  Comprehensive metabolic panel  Result Value Ref Range   Glucose 100 (H) 65 - 99 mg/dL   BUN 13 6 - 24 mg/dL   Creatinine, Ser 1.61 0.57 - 1.00 mg/dL   GFR calc non Af Amer 87 >59 mL/min/1.73   GFR calc Af Amer 100 >59 mL/min/1.73   BUN/Creatinine Ratio 16 9 - 23   Sodium 134 134 - 144 mmol/L   Potassium 3.8 3.5 - 5.2 mmol/L   Chloride 95 (L) 96 - 106 mmol/L   CO2 23 18 - 29 mmol/L   Calcium 9.5 8.7 - 10.2 mg/dL   Total Protein 6.9 6.0 - 8.5 g/dL   Albumin 4.5 3.5 - 5.5 g/dL   Globulin, Total 2.4 1.5 - 4.5 g/dL   Albumin/Globulin Ratio 1.9 1.2 - 2.2   Bilirubin Total 0.3 0.0 - 1.2 mg/dL   Alkaline Phosphatase 62 39 - 117 IU/L   AST 22 0 - 40 IU/L   ALT 20 0 - 32 IU/L  Lipid Panel w/o Chol/HDL Ratio  Result Value Ref Range   Cholesterol, Total 217 (H) 100 - 199 mg/dL   Triglycerides 096 (H) 0 - 149 mg/dL   HDL 64 >04 mg/dL   VLDL Cholesterol Cal 33 5 - 40 mg/dL   LDL Calculated 540 (H) 0 - 99 mg/dL  Microalbumin, Urine Waived  Result Value Ref Range   Microalb, Ur Waived 10 0 - 19 mg/L   Creatinine, Urine Waived 50 10 - 300 mg/dL   Microalb/Creat Ratio 30-300 (H) <30 mg/g  TSH  Result Value Ref Range   TSH 0.369 (L) 0.450 - 4.500 uIU/mL  VITAMIN D 25 Hydroxy (Vit-D Deficiency, Fractures)  Result Value Ref Range   Vit D, 25-Hydroxy 25.8 (L) 30.0 - 100.0 ng/mL      Assessment & Plan:   Problem List Items Addressed This Visit      Endocrine   Hypothyroidism    Rechecking levels today. Await results. Call with any concerns.       Relevant Orders   CBC with Differential/Platelet   Comprehensive metabolic panel   TSH     Genitourinary   Benign hypertensive renal disease    Under good control. Continue to  monitor. Call with any concerns. Refills given today.  Relevant Orders   CBC with Differential/Platelet   Comprehensive metabolic panel   Microalbumin, Urine Waived   UA/M w/rflx Culture, Routine     Other   Vitamin D deficiency    Rechecking levels today. Await results. Call with any concerns.       Relevant Orders   CBC with Differential/Platelet   Comprehensive metabolic panel   VITAMIN D 25 Hydroxy (Vit-D Deficiency, Fractures)   Hyperlipidemia    Rechecking levels today. Await results. Call with any concerns.       Relevant Medications   hydrochlorothiazide (HYDRODIURIL) 25 MG tablet   Other Relevant Orders   CBC with Differential/Platelet   Comprehensive metabolic panel   Lipid Panel w/o Chol/HDL Ratio    Other Visit Diagnoses    Routine general medical examination at a health care facility    -  Primary   Vaccines up to date. Screening labs checked today. Pap and mammo through GYN. Colonoscopy ordered today. Call with any concerns.    Relevant Orders   CBC with Differential/Platelet   Comprehensive metabolic panel   UA/M w/rflx Culture, Routine   Screening for colon cancer       Colonscopy ordered today.   Relevant Orders   Ambulatory referral to Gastroenterology   Paresthesias       Will check labs. Start wearing brace at night. Call with any concerns or if not getting better.        Follow up plan: Return in about 6 months (around 12/23/2017) for follow up.   LABORATORY TESTING:  - Pap smear: up to date  IMMUNIZATIONS:   - Tdap: Tetanus vaccination status reviewed: last tetanus booster within 10 years. - Influenza: Up to date  SCREENING: -Mammogram: Up to date  - Colonoscopy: Ordered today    PATIENT COUNSELING:   Advised to take 1 mg of folate supplement per day if capable of pregnancy.   Sexuality: Discussed sexually transmitted diseases, partner selection, use of condoms, avoidance of unintended pregnancy  and contraceptive alternatives.     Advised to avoid cigarette smoking.  I discussed with the patient that most people either abstain from alcohol or drink within safe limits (<=14/week and <=4 drinks/occasion for males, <=7/weeks and <= 3 drinks/occasion for females) and that the risk for alcohol disorders and other health effects rises proportionally with the number of drinks per week and how often a drinker exceeds daily limits.  Discussed cessation/primary prevention of drug use and availability of treatment for abuse.   Diet: Encouraged to adjust caloric intake to maintain  or achieve ideal body weight, to reduce intake of dietary saturated fat and total fat, to limit sodium intake by avoiding high sodium foods and not adding table salt, and to maintain adequate dietary potassium and calcium preferably from fresh fruits, vegetables, and low-fat dairy products.    stressed the importance of regular exercise  Injury prevention: Discussed safety belts, safety helmets, smoke detector, smoking near bedding or upholstery.   Dental health: Discussed importance of regular tooth brushing, flossing, and dental visits.    NEXT PREVENTATIVE PHYSICAL DUE IN 1 YEAR. Return in about 6 months (around 12/23/2017) for follow up.

## 2017-06-25 NOTE — Assessment & Plan Note (Signed)
Rechecking levels today. Await results. Call with any concerns.  

## 2017-06-25 NOTE — Patient Instructions (Addendum)
Health Maintenance, Female Adopting a healthy lifestyle and getting preventive care can go a long way to promote health and wellness. Talk with your health care provider about what schedule of regular examinations is right for you. This is a good chance for you to check in with your provider about disease prevention and staying healthy. In between checkups, there are plenty of things you can do on your own. Experts have done a lot of research about which lifestyle changes and preventive measures are most likely to keep you healthy. Ask your health care provider for more information. Weight and diet Eat a healthy diet  Be sure to include plenty of vegetables, fruits, low-fat dairy products, and lean protein.  Do not eat a lot of foods high in solid fats, added sugars, or salt.  Get regular exercise. This is one of the most important things you can do for your health. ? Most adults should exercise for at least 150 minutes each week. The exercise should increase your heart rate and make you sweat (moderate-intensity exercise). ? Most adults should also do strengthening exercises at least twice a week. This is in addition to the moderate-intensity exercise.  Maintain a healthy weight  Body mass index (BMI) is a measurement that can be used to identify possible weight problems. It estimates body fat based on height and weight. Your health care provider can help determine your BMI and help you achieve or maintain a healthy weight.  For females 20 years of age and older: ? A BMI below 18.5 is considered underweight. ? A BMI of 18.5 to 24.9 is normal. ? A BMI of 25 to 29.9 is considered overweight. ? A BMI of 30 and above is considered obese.  Watch levels of cholesterol and blood lipids  You should start having your blood tested for lipids and cholesterol at 52 years of age, then have this test every 5 years.  You may need to have your cholesterol levels checked more often if: ? Your lipid or  cholesterol levels are high. ? You are older than 52 years of age. ? You are at high risk for heart disease.  Cancer screening Lung Cancer  Lung cancer screening is recommended for adults 55-80 years old who are at high risk for lung cancer because of a history of smoking.  A yearly low-dose CT scan of the lungs is recommended for people who: ? Currently smoke. ? Have quit within the past 15 years. ? Have at least a 30-pack-year history of smoking. A pack year is smoking an average of one pack of cigarettes a day for 1 year.  Yearly screening should continue until it has been 15 years since you quit.  Yearly screening should stop if you develop a health problem that would prevent you from having lung cancer treatment.  Breast Cancer  Practice breast self-awareness. This means understanding how your breasts normally appear and feel.  It also means doing regular breast self-exams. Let your health care provider know about any changes, no matter how small.  If you are in your 20s or 30s, you should have a clinical breast exam (CBE) by a health care provider every 1-3 years as part of a regular health exam.  If you are 40 or older, have a CBE every year. Also consider having a breast X-ray (mammogram) every year.  If you have a family history of breast cancer, talk to your health care provider about genetic screening.  If you are at high risk   for breast cancer, talk to your health care provider about having an MRI and a mammogram every year.  Breast cancer gene (BRCA) assessment is recommended for women who have family members with BRCA-related cancers. BRCA-related cancers include: ? Breast. ? Ovarian. ? Tubal. ? Peritoneal cancers.  Results of the assessment will determine the need for genetic counseling and BRCA1 and BRCA2 testing.  Cervical Cancer Your health care provider may recommend that you be screened regularly for cancer of the pelvic organs (ovaries, uterus, and  vagina). This screening involves a pelvic examination, including checking for microscopic changes to the surface of your cervix (Pap test). You may be encouraged to have this screening done every 3 years, beginning at age 22.  For women ages 56-65, health care providers may recommend pelvic exams and Pap testing every 3 years, or they may recommend the Pap and pelvic exam, combined with testing for human papilloma virus (HPV), every 5 years. Some types of HPV increase your risk of cervical cancer. Testing for HPV may also be done on women of any age with unclear Pap test results.  Other health care providers may not recommend any screening for nonpregnant women who are considered low risk for pelvic cancer and who do not have symptoms. Ask your health care provider if a screening pelvic exam is right for you.  If you have had past treatment for cervical cancer or a condition that could lead to cancer, you need Pap tests and screening for cancer for at least 20 years after your treatment. If Pap tests have been discontinued, your risk factors (such as having a new sexual partner) need to be reassessed to determine if screening should resume. Some women have medical problems that increase the chance of getting cervical cancer. In these cases, your health care provider may recommend more frequent screening and Pap tests.  Colorectal Cancer  This type of cancer can be detected and often prevented.  Routine colorectal cancer screening usually begins at 52 years of age and continues through 52 years of age.  Your health care provider may recommend screening at an earlier age if you have risk factors for colon cancer.  Your health care provider may also recommend using home test kits to check for hidden blood in the stool.  A small camera at the end of a tube can be used to examine your colon directly (sigmoidoscopy or colonoscopy). This is done to check for the earliest forms of colorectal  cancer.  Routine screening usually begins at age 33.  Direct examination of the colon should be repeated every 5-10 years through 52 years of age. However, you may need to be screened more often if early forms of precancerous polyps or small growths are found.  Skin Cancer  Check your skin from head to toe regularly.  Tell your health care provider about any new moles or changes in moles, especially if there is a change in a mole's shape or color.  Also tell your health care provider if you have a mole that is larger than the size of a pencil eraser.  Always use sunscreen. Apply sunscreen liberally and repeatedly throughout the day.  Protect yourself by wearing long sleeves, pants, a wide-brimmed hat, and sunglasses whenever you are outside.  Heart disease, diabetes, and high blood pressure  High blood pressure causes heart disease and increases the risk of stroke. High blood pressure is more likely to develop in: ? People who have blood pressure in the high end of  the normal range (130-139/85-89 mm Hg). ? People who are overweight or obese. ? People who are African American.  If you are 21-29 years of age, have your blood pressure checked every 3-5 years. If you are 3 years of age or older, have your blood pressure checked every year. You should have your blood pressure measured twice-once when you are at a hospital or clinic, and once when you are not at a hospital or clinic. Record the average of the two measurements. To check your blood pressure when you are not at a hospital or clinic, you can use: ? An automated blood pressure machine at a pharmacy. ? A home blood pressure monitor.  If you are between 17 years and 37 years old, ask your health care provider if you should take aspirin to prevent strokes.  Have regular diabetes screenings. This involves taking a blood sample to check your fasting blood sugar level. ? If you are at a normal weight and have a low risk for diabetes,  have this test once every three years after 52 years of age. ? If you are overweight and have a high risk for diabetes, consider being tested at a younger age or more often. Preventing infection Hepatitis B  If you have a higher risk for hepatitis B, you should be screened for this virus. You are considered at high risk for hepatitis B if: ? You were born in a country where hepatitis B is common. Ask your health care provider which countries are considered high risk. ? Your parents were born in a high-risk country, and you have not been immunized against hepatitis B (hepatitis B vaccine). ? You have HIV or AIDS. ? You use needles to inject street drugs. ? You live with someone who has hepatitis B. ? You have had sex with someone who has hepatitis B. ? You get hemodialysis treatment. ? You take certain medicines for conditions, including cancer, organ transplantation, and autoimmune conditions.  Hepatitis C  Blood testing is recommended for: ? Everyone born from [birth year] through 1965. ? Anyone with known risk factors for hepatitis C.  Sexually transmitted infections (STIs)  You should be screened for sexually transmitted infections (STIs) including gonorrhea and chlamydia if: ? You are sexually active and are younger than 52 years of age. ? You are older than 52 years of age and your health care provider tells you that you are at risk for this type of infection. ? Your sexual activity has changed since you were last screened and you are at an increased risk for chlamydia or gonorrhea. Ask your health care provider if you are at risk.  If you do not have HIV, but are at risk, it may be recommended that you take a prescription medicine daily to prevent HIV infection. This is called pre-exposure prophylaxis (PrEP). You are considered at risk if: ? You are sexually active and do not regularly use condoms or know the HIV status of your partner(s). ? You take drugs by injection. ? You are  sexually active with a partner who has HIV.  Talk with your health care provider about whether you are at high risk of being infected with HIV. If you choose to begin PrEP, you should first be tested for HIV. You should then be tested every 3 months for as long as you are taking PrEP. Pregnancy  If you are premenopausal and you may become pregnant, ask your health care provider about preconception counseling.  If you may become  pregnant, take 400 to 800 micrograms (mcg) of folic acid every day.  If you want to prevent pregnancy, talk to your health care provider about birth control (contraception). Osteoporosis and menopause  Osteoporosis is a disease in which the bones lose minerals and strength with aging. This can result in serious bone fractures. Your risk for osteoporosis can be identified using a bone density scan.  If you are 42 years of age or older, or if you are at risk for osteoporosis and fractures, ask your health care provider if you should be screened.  Ask your health care provider whether you should take a calcium or vitamin D supplement to lower your risk for osteoporosis.  Menopause may have certain physical symptoms and risks.  Hormone replacement therapy may reduce some of these symptoms and risks. Talk to your health care provider about whether hormone replacement therapy is right for you. Follow these instructions at home:  Schedule regular health, dental, and eye exams.  Stay current with your immunizations.  Do not use any tobacco products including cigarettes, chewing tobacco, or electronic cigarettes.  If you are pregnant, do not drink alcohol.  If you are breastfeeding, limit how much and how often you drink alcohol.  Limit alcohol intake to no more than 1 drink per day for nonpregnant women. One drink equals 12 ounces of beer, 5 ounces of wine, or 1 ounces of hard liquor.  Do not use street drugs.  Do not share needles.  Ask your health care  provider for help if you need support or information about quitting drugs.  Tell your health care provider if you often feel depressed.  Tell your health care provider if you have ever been abused or do not feel safe at home. This information is not intended to replace advice given to you by your health care provider. Make sure you discuss any questions you have with your health care provider. Document Released: 10/31/2010 Document Revised: 09/23/2015 Document Reviewed: 01/19/2015 Elsevier Interactive Patient Education  2018 [place] Syndrome Carpal tunnel syndrome is a condition that causes pain in your hand and arm. The carpal tunnel is a narrow area that is on the palm side of your wrist. Repeated wrist motion or certain diseases may cause swelling in the tunnel. This swelling can pinch the main nerve in the wrist (median nerve). Follow these instructions at home: If you have a splint:  Wear it as told by your doctor. Remove it only as told by your doctor.  Loosen the splint if your fingers: ? Become numb and tingle. ? Turn blue and cold.  Keep the splint clean and dry. General instructions  Take over-the-counter and prescription medicines only as told by your doctor.  Rest your wrist from any activity that may be causing your pain. If needed, talk to your employer about changes that can be made in your work, such as getting a wrist pad to use while typing.  If directed, apply ice to the painful area: ? Put ice in a plastic bag. ? Place a towel between your skin and the bag. ? Leave the ice on for 20 minutes, 2-3 times per day.  Keep all follow-up visits as told by your doctor. This is important.  Do any exercises as told by your doctor, physical therapist, or occupational therapist. Contact a doctor if:  You have new symptoms.  Medicine does not help your pain.  Your symptoms get worse. This information is not intended to replace  advice given to you  by your health care provider. Make sure you discuss any questions you have with your health care provider. Document Released: 04/06/2011 Document Revised: 09/23/2015 Document Reviewed: 09/02/2014 Elsevier Interactive Patient Education  2018 [place].  Wrist and Forearm Exercises Ask your health care provider which exercises are safe for you. Do exercises exactly as told by your health care provider and adjust them as directed. It is normal to feel mild stretching, pulling, tightness, or discomfort as you do these exercises, but you should stop right away if you feel sudden pain or your pain gets worse. Do not begin these exercises until told by your health care provider. RANGE OF MOTION EXERCISES These exercises warm up your muscles and joints and improve the movement and flexibility of your injured wrist and forearm. These exercises also help to relieve pain, numbness, and tingling. These exercises are done using the muscles in your injured wrist and forearm. Exercise A: Wrist Flexion, Active 1. With your fingers relaxed, bend your wrist forward as far as you can. 2. Hold this position for __________ seconds. Repeat __________ times. Complete this exercise __________ times a day. Exercise B: Wrist Extension, Active 1. With your fingers relaxed, bend your wrist backward as far as you can. 2. Hold this position for __________ seconds. Repeat __________ times. Complete this exercise __________ times a day. Exercise C: Supination, Active  1. Stand or sit with your arms at your sides. 2. Bend your left / right elbow to an "L" shape (90 degrees). 3. Turn your palm upward until you feel a gentle stretch on the inside of your forearm. 4. Hold this position for __________ seconds. 5. Slowly return your palm to the starting position. Repeat __________ times. Complete this exercise __________ times a day. Exercise D: Pronation, Active  1. Stand or sit with your arms at your sides. 2. Bend your  left / right elbow to an "L" shape (90 degrees). 3. Turn your palm downward until you feel a gentle stretch on the top of your forearm. 4. Hold this position for __________ seconds. 5. Slowly return your palm to the starting position. Repeat __________ times. Complete this exercise __________ times a day. STRETCHING EXERCISES These exercises warm up your muscles and joints and improve the movement and flexibility of your injured wrist and forearm. These exercises also help to relieve pain, numbness, and tingling. These exercises are done using your healthy wrist and forearm to help stretch the muscles in your injured wrist and forearm. Exercise E: Wrist Flexion, Passive  1. Extend your left / right arm in front of you, relax your wrist, and point your fingers downward. 2. Gently push on the back of your hand. Stop when you feel a gentle stretch on the top of your forearm. 3. Hold this position for __________ seconds. Repeat __________ times. Complete this exercise __________ times a day. Exercise F: Wrist Extension, Passive  1. Extend your left / right arm in front of you and turn your palm upward. 2. Gently pull your palm and fingertips back so your fingers point downward. You should feel a gentle stretch on the palm-side of your forearm. 3. Hold this position for __________ seconds. Repeat __________ times. Complete this exercise __________ times a day. Exercise G: Forearm Rotation, Supination, Passive 1. Sit with your left / right elbow bent to an "L" shape (90 degrees) with your forearm resting on a table. 2. Keeping your upper body and shoulder still, use your other hand to rotate  your forearm palm-up until you feel a gentle to moderate stretch. 3. Hold this position for __________ seconds. 4. Slowly release the stretch and return to the starting position. Repeat __________ times. Complete this exercise __________ times a day. Exercise H: Forearm Rotation, Pronation, Passive 1. Sit  with your left / right elbow bent to an "L" shape (90 degrees) with your forearm resting on a table. 2. Keeping your upper body and shoulder still, use your other hand to rotate your forearm palm-down until you feel a gentle to moderate stretch. 3. Hold this position for __________ seconds. 4. Slowly release the stretch and return to the starting position. Repeat __________ times. Complete this exercise __________ times a day. STRENGTHENING EXERCISES These exercises build strength and endurance in your wrist and forearm. Endurance is the ability to use your muscles for a long time, even after they get tired. Exercise I: Wrist Flexors  1. Sit with your left / right forearm supported on a table and your hand resting palm-up over the edge of the table. Your elbow should be bent to an "L" shape (about 90 degrees) and be below the level of your shoulder. 2. Hold a __________ weight in your left / right hand. Or, hold a rubber exercise band or tube in both hands, keeping your hands at the same level and hip distance apart. There should be a slight tension in the exercise band or tube. 3. Slowly curl your hand up toward your forearm. 4. Hold this position for __________ seconds. 5. Slowly lower your hand back to the starting position. Repeat __________ times. Complete this exercise __________ times a day. Exercise J: Wrist Extensors  1. Sit with your left / right forearm supported on a table and your hand resting palm-down over the edge of the table. Your elbow should be bent to an "L" shape (about 90 degrees) and be below the level of your shoulder. 2. Hold a __________ weight in your left / right hand. Or, hold a rubber exercise band or tube in both hands, keeping your hands at the same level and hip distance apart. There should be a slight tension in the exercise band or tube. 3. Slowly curl your hand up toward your forearm. 4. Hold this position for __________ seconds. 5. Slowly lower your hand  back to the starting position. Repeat __________ times. Complete this exercise __________ times a day. Exercise K: Forearm Rotation, Supination  1. Sit with your left / right forearm supported on a table and your hand resting palm-down. Your elbow should be at your side, bent to an "L" shape (about 90 degrees), and below the level of your shoulder. Keep your wrist stable and in a neutral position throughout the exercise. 2. Gently hold a lightweight hammer with your left / right hand. 3. Without moving your elbow or wrist, slowly rotate your palm upward to a thumbs-up position. 4. Hold this position for __________ seconds. 5. Slowly return your forearm to the starting position. Repeat __________ times. Complete this exercise __________ times a day. Exercise L: Forearm Rotation, Pronation  1. Sit with your left / right forearm supported on a table and your hand resting palm-up. Your elbow should be at your side, bent to an "L" shape (about 90 degrees), and below the level of your shoulder. Keep your wrist stable. Do not allow it to move backward or forward during the exercise. 2. Gently hold a lightweight hammer with your left / right hand. 3. Without moving your elbow or  wrist, slowly rotate your palm and hand upward to a thumbs-up position. 4. Hold this position for __________ seconds. 5. Slowly return your forearm to the starting position. Repeat __________ times. Complete this exercise __________ times a day. Exercise M: Grip Strengthening  1. Hold one of these items in your left / right hand: play dough, therapy putty, a dense sponge, a stress ball, or a large, rolled sock. 2. Squeeze as hard as you can without increasing pain. 3. Hold this position for __________ seconds. 4. Slowly release your grip. Repeat __________ times. Complete this exercise __________ times a day. This information is not intended to replace advice given to you by your health care provider. Make sure you discuss  any questions you have with your health care provider. Document Released: 03/01/2005 Document Revised: 01/10/2016 Document Reviewed: 01/10/2015 Elsevier Interactive Patient Education  [employer].

## 2017-06-25 NOTE — Assessment & Plan Note (Addendum)
Under good control. Continue to monitor. Call with any concerns. Refills given today. 

## 2017-06-26 ENCOUNTER — Other Ambulatory Visit: Payer: Self-pay | Admitting: Family Medicine

## 2017-06-26 LAB — CBC WITH DIFFERENTIAL/PLATELET
BASOS: 1 %
Basophils Absolute: 0.1 10*3/uL (ref 0.0–0.2)
EOS (ABSOLUTE): 0.2 10*3/uL (ref 0.0–0.4)
EOS: 3 %
HEMATOCRIT: 41.1 % (ref 34.0–46.6)
HEMOGLOBIN: 14.5 g/dL (ref 11.1–15.9)
IMMATURE GRANULOCYTES: 0 %
Immature Grans (Abs): 0 10*3/uL (ref 0.0–0.1)
LYMPHS ABS: 2 10*3/uL (ref 0.7–3.1)
Lymphs: 27 %
MCH: 32.2 pg (ref 26.6–33.0)
MCHC: 35.3 g/dL (ref 31.5–35.7)
MCV: 91 fL (ref 79–97)
MONOCYTES: 9 %
MONOS ABS: 0.7 10*3/uL (ref 0.1–0.9)
NEUTROS PCT: 60 %
Neutrophils Absolute: 4.5 10*3/uL (ref 1.4–7.0)
Platelets: 351 10*3/uL (ref 150–379)
RBC: 4.5 x10E6/uL (ref 3.77–5.28)
RDW: 12.8 % (ref 12.3–15.4)
WBC: 7.5 10*3/uL (ref 3.4–10.8)

## 2017-06-26 LAB — TSH: TSH: 0.696 u[IU]/mL (ref 0.450–4.500)

## 2017-06-26 LAB — LIPID PANEL W/O CHOL/HDL RATIO
Cholesterol, Total: 241 mg/dL — ABNORMAL HIGH (ref 100–199)
HDL: 66 mg/dL (ref >39)
LDL CALC: 146 mg/dL — AB (ref 0–99)
Triglycerides: 143 mg/dL (ref 0–149)
VLDL Cholesterol Cal: 29 mg/dL (ref 5–40)

## 2017-06-26 LAB — COMPREHENSIVE METABOLIC PANEL
A/G RATIO: 1.8 (ref 1.2–2.2)
ALBUMIN: 4.8 g/dL (ref 3.5–5.5)
ALT: 28 IU/L (ref 0–32)
AST: 25 IU/L (ref 0–40)
Alkaline Phosphatase: 68 IU/L (ref 39–117)
BUN / CREAT RATIO: 13 (ref 9–23)
BUN: 11 mg/dL (ref 6–24)
Bilirubin Total: 0.4 mg/dL (ref 0.0–1.2)
CO2: 23 mmol/L (ref 20–29)
CREATININE: 0.82 mg/dL (ref 0.57–1.00)
Calcium: 9.4 mg/dL (ref 8.7–10.2)
Chloride: 90 mmol/L — ABNORMAL LOW (ref 96–106)
GFR calc non Af Amer: 83 mL/min/{1.73_m2} (ref >59)
GFR, EST AFRICAN AMERICAN: 96 mL/min/{1.73_m2} (ref >59)
GLOBULIN, TOTAL: 2.7 g/dL (ref 1.5–4.5)
Glucose: 79 mg/dL (ref 65–99)
POTASSIUM: 3.7 mmol/L (ref 3.5–5.2)
SODIUM: 131 mmol/L — AB (ref 134–144)
Total Protein: 7.5 g/dL (ref 6.0–8.5)

## 2017-06-26 LAB — VITAMIN D 25 HYDROXY (VIT D DEFICIENCY, FRACTURES): VIT D 25 HYDROXY: 24.7 ng/mL — AB (ref 30.0–100.0)

## 2017-06-26 MED ORDER — LEVOTHYROXINE SODIUM 75 MCG PO TABS: 75.0000 ug | ORAL_TABLET | Freq: Every day | ORAL | 4 refills | Status: DC

## 2017-06-27 LAB — UA/M W/RFLX CULTURE, ROUTINE
Bilirubin, UA: NEGATIVE
GLUCOSE, UA: NEGATIVE
Ketones, UA: NEGATIVE
Nitrite, UA: NEGATIVE
PH UA: 7 (ref 5.0–7.5)
PROTEIN UA: NEGATIVE
SPEC GRAV UA: 1.015 (ref 1.005–1.030)
Urobilinogen, Ur: 0.2 mg/dL (ref 0.2–1.0)

## 2017-06-27 LAB — MICROSCOPIC EXAMINATION

## 2017-06-27 LAB — MICROALBUMIN, URINE WAIVED
CREATININE, URINE WAIVED: 50 mg/dL (ref 10–300)
MICROALB, UR WAIVED: 10 mg/L (ref 0–19)
Microalb/Creat Ratio: <30 mg/g (ref <30)

## 2017-06-27 LAB — URINE CULTURE, REFLEX

## 2017-06-28 ENCOUNTER — Telehealth: Payer: Self-pay

## 2017-06-28 NOTE — Telephone Encounter (Signed)
Gastroenterology Pre-Procedure Review  Request Date: 07/13/17  Requesting Physician: Dr. Servando SnareWohl   PATIENT REVIEW QUESTIONS: The patient responded to the following health history questions as indicated:    1. Are you having any GI issues? No  2. Do you have a personal history of Polyps?no  3. Do you have a family history of Colon Cancer or Polyps? Yes, Colon Ca MGF 4. Diabetes Mellitus? No  5. Joint replacements in the past 12 months? No  6. Major health problems in the past 3 months? No  7. Any artificial heart valves, MVP, or defibrillator? No     MEDICATIONS & ALLERGIES:    Patient reports the following regarding taking any anticoagulation/antiplatelet therapy:   Plavix, Coumadin, Eliquis, Xarelto, Lovenox, Pradaxa, Brilinta, or Effient? No  Aspirin? No   Patient confirms/reports the following medications:  Current Outpatient Medications  Medication Sig Dispense Refill  . hydrochlorothiazide (HYDRODIURIL) 25 MG tablet Take 1 tablet (25 mg total) by mouth daily. 90 tablet 1  . levothyroxine (SYNTHROID, LEVOTHROID) 75 MCG tablet Take 1 tablet (75 mcg total) by mouth daily before breakfast. 90 tablet 4  . norethindrone (CAMILA) 0.35 MG tablet Take 1 tablet (0.35 mg total) by mouth daily. 84 tablet 2  . OXcarbazepine (TRILEPTAL) 150 MG tablet Take 450-600 mg by mouth See admin instructions. Takes 3 tablets (450mg ) orally in the morning and 4 tablets (600mg ) orally at bedtime.     No current facility-administered medications for this visit.     Patient confirms/reports the following allergies:  No Known Allergies  No orders of the defined types were placed in this encounter.   AUTHORIZATION INFORMATION Primary Insurance: 1D#: Group #:  Secondary Insurance: 1D#: Group #:  SCHEDULE INFORMATION: Date: 07/13/17 Time: Location: [place]

## 2017-06-30 ENCOUNTER — Other Ambulatory Visit: Payer: Self-pay

## 2017-06-30 DIAGNOSIS — Z1211 Encounter for screening for malignant neoplasm of colon: Secondary | ICD-10-CM

## 2017-07-12 ENCOUNTER — Encounter: Payer: BLUE CROSS/BLUE SHIELD | Admitting: Obstetrics and Gynecology

## 2017-07-13 ENCOUNTER — Ambulatory Visit: Admit: 2017-07-13 | Payer: BLUE CROSS/BLUE SHIELD | Admitting: Gastroenterology

## 2017-07-13 SURGERY — COLONOSCOPY WITH PROPOFOL
Anesthesia: General

## 2017-08-23 ENCOUNTER — Ambulatory Visit: Payer: BLUE CROSS/BLUE SHIELD | Admitting: Obstetrics and Gynecology

## 2017-08-23 ENCOUNTER — Encounter: Payer: BLUE CROSS/BLUE SHIELD | Admitting: Obstetrics and Gynecology

## 2017-08-23 ENCOUNTER — Encounter: Payer: Self-pay | Admitting: Family Medicine

## 2017-08-23 ENCOUNTER — Encounter: Payer: Self-pay | Admitting: Obstetrics and Gynecology

## 2017-08-23 VITALS — BP 134/79 | HR 88 | Ht 64.0 in | Wt 208.4 lb

## 2017-08-23 DIAGNOSIS — E669 Obesity, unspecified: Secondary | ICD-10-CM | POA: Diagnosis not present

## 2017-08-23 MED ORDER — CYANOCOBALAMIN 1000 MCG/ML IJ SOLN: 1000.0000 ug | INTRAMUSCULAR | 1 refills | Status: DC

## 2017-08-23 NOTE — Progress Notes (Signed)
Subjective:  Angela Boone is a 52 y.o. No obstetric history on file. at Unknown being seen today for weight loss management- initial visit.  Patient reports General ROS: negative and reports previous weight loss attempts:weight watchers and adipex.     Past evaluation has included: metabolic profile, hemoglobin A1c, thyroid panel.  Past treatment has included: small frequent feedings, nutritional supplement, vitamin supplement, antidepressant, vitamin B-12 injections, appetite stimulant, exercise management and discontinuation of medication.  The following portions of the patient's history were reviewed and updated as appropriate: allergies, current medications, past family history, past medical history, past social history, past surgical history and problem list.   Objective:   Vitals:   08/23/17 1442  BP: 134/79  Pulse: 88  Weight: 208 lb 6.4 oz (94.5 kg)  Height: 5\' 4"  (1.626 m)    General:  Alert, oriented and cooperative. Patient is in no acute distress.  :   :   :   :   :   :   PE: Well groomed female in no current distress,   Mental Status: Normal mood and affect. Normal behavior. Normal judgment and thought content.   Current BMI: Body mass index is 35.77 kg/m.   Assessment and Plan:  Obesity  There are no diagnoses linked to this encounter.  Plan: low carb, High protein diet RX for adipex 37.5 mg daily and B12 1000mcg.ml monthly, to start now with first injection given at today's visit. Reviewed side-effects common to both medications and expected outcomes. Increase daily water intake to at least 8 bottle a day, every day.  Goal is to reduse weight by 10% by end of three months, and will re-evaluate then.  RTC in 4 weeks for Nurse visit to check weight & BP, and get next B12 injections.    Please refer to After Visit Summary for other counseling recommendations.    SedaliaShambley, Lasondra Hodgkins N, CNM   Juandedios Dudash McDonaldN Nox Talent, CNM      Consider the Low  Glycemic Index Diet and 6 smaller meals daily .  This boosts your metabolism and regulates your sugars:   Use the protein bar by Atkins because they have lots of fiber in them  Find the low carb flatbreads, tortillas and pita breads for sandwiches:  Joseph's makes a pita bread and a flat bread , available at [facility] and [employer]; Toufayah makes a low carb flatbread available at [employer] and HT that is 9 net carbs and 100 cal Mission makes a low carb whole wheat tortilla available at [employer],and most grocery stores with 6 net carbs and 210 cal  [place] yogurt can still have a lot of carbs .  Dannon Light N fit has 80 cal and 8 carbs

## 2017-08-23 NOTE — Patient Instructions (Signed)

## 2017-08-31 ENCOUNTER — Encounter: Payer: BLUE CROSS/BLUE SHIELD | Admitting: Obstetrics and Gynecology

## 2017-08-31 ENCOUNTER — Encounter: Payer: Self-pay | Admitting: Obstetrics and Gynecology

## 2017-09-18 ENCOUNTER — Other Ambulatory Visit: Payer: Self-pay | Admitting: Obstetrics and Gynecology

## 2017-09-18 ENCOUNTER — Ambulatory Visit (INDEPENDENT_AMBULATORY_CARE_PROVIDER_SITE_OTHER): Payer: BLUE CROSS/BLUE SHIELD | Admitting: Obstetrics and Gynecology

## 2017-09-18 ENCOUNTER — Encounter: Payer: Self-pay | Admitting: Obstetrics and Gynecology

## 2017-09-18 VITALS — BP 148/84 | HR 95 | Ht 64.0 in | Wt 199.4 lb

## 2017-09-18 DIAGNOSIS — E559 Vitamin D deficiency, unspecified: Secondary | ICD-10-CM | POA: Diagnosis not present

## 2017-09-18 DIAGNOSIS — E669 Obesity, unspecified: Secondary | ICD-10-CM

## 2017-09-18 DIAGNOSIS — Z01419 Encounter for gynecological examination (general) (routine) without abnormal findings: Secondary | ICD-10-CM

## 2017-09-18 MED ORDER — DOXYCYCLINE HYCLATE 100 MG PO CAPS: 100.0000 mg | ORAL_CAPSULE | Freq: Two times a day (BID) | ORAL | 2 refills | Status: DC

## 2017-09-18 MED ORDER — VITAMIN D (ERGOCALCIFEROL) 1.25 MG (50000 UNIT) PO CAPS: 50000.0000 [IU] | ORAL_CAPSULE | ORAL | 1 refills | Status: DC

## 2017-09-18 NOTE — Progress Notes (Signed)
Subjective:   Angela Boone is a 53 y.o. No obstetric history on file. Caucasian female here for a routine well-woman exam.  No LMP recorded. Patient has had an ablation.    Current complaints: continuing to work on weight. PCP: Leatrice Jewels       does desire labs  Social History: Sexual: heterosexual Marital Status: divorced Living situation: with spouse Occupation: [employer] Tobacco/alcohol: no tobacco use Illicit drugs: no history of illicit drug use  The following portions of the patient's history were reviewed and updated as appropriate: allergies, current medications, past family history, past medical history, past social history, past surgical history and problem list.  Past Medical History Past Medical History:  Diagnosis Date  . Hypertension   . Hypothyroidism   . Rosacea   . Trigeminal neuralgia     Past Surgical History Past Surgical History:  Procedure Laterality Date  . BUNIONECTOMY    . HAMMER TOE SURGERY Left     Gynecologic History No obstetric history on file.  No LMP recorded. Patient has had an ablation. Contraception: post menopausal status Last Pap: 2015. Results were: normal Last mammogram: 2019. Results were: normal  Obstetric History OB History  No data available    Current Medications Current Outpatient Medications on File Prior to Visit  Medication Sig Dispense Refill  . cyanocobalamin (,VITAMIN B-12,) 1000 MCG/ML injection Inject 1 mL (1,000 mcg total) into the muscle every 30 (thirty) days. 10 mL 1  . hydrochlorothiazide (HYDRODIURIL) 25 MG tablet Take 1 tablet (25 mg total) by mouth daily. 90 tablet 1  . levothyroxine (SYNTHROID, LEVOTHROID) 75 MCG tablet Take 1 tablet (75 mcg total) by mouth daily before breakfast. 90 tablet 4  . norethindrone (CAMILA) 0.35 MG tablet Take 1 tablet (0.35 mg total) by mouth daily. 84 tablet 2  . OXcarbazepine (TRILEPTAL) 150 MG tablet Take 450-600 mg by mouth See admin instructions.  Takes 3 tablets ( ) orally in the morning and 4 tablets ( ) orally at bedtime.    . phentermine (ADIPEX-P) 37.5 MG tablet Take 37.5 mg by mouth daily before breakfast.     No current facility-administered medications on file prior to visit.     Review of Systems Patient denies any headaches, blurred vision, shortness of breath, chest pain, abdominal pain, problems with bowel movements, urination, or intercourse.  Objective:  BP (!) 148/84   Pulse 95   Ht  (1.626 m)   Wt 199 lb 6.4 oz (90.4 kg)   BMI 34.23 kg/m  Physical Exam  General:  Well developed, well nourished, no acute distress. She is alert and oriented x3. Skin:  Warm and dry Neck:  Midline trachea, no thyromegaly or nodules Cardiovascular: Regular rate and rhythm, no murmur heard Lungs:  Effort normal, all lung fields clear to auscultation bilaterally Breasts:  No dominant palpable mass, retraction, or nipple discharge Abdomen:  Soft, non tender, no hepatosplenomegaly or masses Pelvic:  External genitalia is normal in appearance.  The vagina is normal in appearance. The cervix is bulbous, no CMT.  Thin prep pap is done with HR HPV cotesting. Uterus is felt to be normal size, shape, and contour.  No adnexal masses or tenderness noted. Extremities:  No swelling or varicosities noted Psych:  She has a normal mood and affect  Assessment:   Healthy well-woman exam Overweight Vitamin d deficiency   Plan:  Labs obtained will follow up accordingly. Will stop OCPs for trial to menopause and draw labs 6-8 weeks later. F/U  1 year for AE, or sooner if needed Mammogram due next Feb. or sooner if problems Colonoscopy due now.  Melody Suzan Nailer, CNM

## 2017-09-18 NOTE — Addendum Note (Signed)
Addended by: Rosine Beat L on: 09/18/2017 09:11 AM   Modules accepted: Orders

## 2017-09-18 NOTE — Patient Instructions (Signed)
Vitamin D Deficiency °Vitamin D deficiency is when your body does not have enough vitamin D. Vitamin D is important to your body for many reasons: °· It helps the body to absorb two important minerals, called calcium and phosphorus. °· It plays a role in bone health. °· It may help to prevent some diseases, such as diabetes and multiple sclerosis. °· It plays a role in muscle function, including heart function. ° °You can get vitamin D by: °· Eating foods that naturally contain vitamin D. °· Eating or drinking milk or other dairy products that have vitamin D added to them. °· Taking a vitamin D supplement or a multivitamin supplement that contains vitamin D. °· Being in the sun. Your body naturally makes vitamin D when your skin is exposed to sunlight. Your body changes the sunlight into a form of the vitamin that the body can use. ° °If vitamin D deficiency is severe, it can cause a condition in which your bones become soft. In adults, this condition is called osteomalacia. In children, this condition is called rickets. °What are the causes? °Vitamin D deficiency may be caused by: °· Not eating enough foods that contain vitamin D. °· Not getting enough sun exposure. °· Having certain digestive system diseases that make it difficult for your body to absorb vitamin D. These diseases include Crohn disease, chronic pancreatitis, and cystic fibrosis. °· Having a surgery in which a part of the stomach or a part of the small intestine is removed. °· Being obese. °· Having chronic kidney disease or liver disease. ° °What increases the risk? °This condition is more likely to develop in: °· Older people. °· People who do not spend much time outdoors. °· People who live in a long-term care facility. °· People who have had broken bones. °· People with weak or thin bones (osteoporosis). °· People who have a disease or condition that changes how the body absorbs vitamin D. °· People who have dark skin. °· People who take certain  medicines, such as steroid medicines or certain seizure medicines. °· People who are overweight or obese. ° °What are the signs or symptoms? °In mild cases of vitamin D deficiency, there may not be any symptoms. If the condition is severe, symptoms may include: °· Bone pain. °· Muscle pain. °· Falling often. °· Broken bones caused by a minor injury. ° °How is this diagnosed? °This condition is usually diagnosed with a blood test. °How is this treated? °Treatment for this condition may depend on what caused the condition. Treatment options include: °· Taking vitamin D supplements. °· Taking a calcium supplement. Your health care provider will suggest what dose is best for you. ° °Follow these instructions at home: °· Take medicines and supplements only as told by your health care provider. °· Eat foods that contain vitamin D. Choices include: °? Fortified dairy products, cereals, or juices. Fortified means that vitamin D has been added to the food. Check the label on the package to be sure. °? Fatty fish, such as salmon or trout. °? Eggs. °? Oysters. °· Do not use a tanning bed. °· Maintain a healthy weight. Lose weight, if needed. °· Keep all follow-up visits as told by your health care provider. This is important. °Contact a health care provider if: °· Your symptoms do not go away. °· You feel like throwing up (nausea) or you throw up (vomit). °· You have fewer bowel movements than usual or it is difficult for you to have a   bowel movement (constipation). °This information is not intended to replace advice given to you by your health care provider. Make sure you discuss any questions you have with your health care provider. °Document Released: 07/10/2011 Document Revised: 09/29/2015 Document Reviewed: 09/02/2014 °Elsevier Interactive Patient Education © 2018 Elsevier Inc. ° °

## 2017-09-19 LAB — LIPID PANEL
CHOLESTEROL TOTAL: 208 mg/dL — AB (ref 100–199)
Chol/HDL Ratio: 3.3 ratio (ref 0.0–4.4)
HDL: 63 mg/dL (ref >39)
LDL CALC: 131 mg/dL — AB (ref 0–99)
Triglycerides: 68 mg/dL (ref 0–149)
VLDL Cholesterol Cal: 14 mg/dL (ref 5–40)

## 2017-09-19 LAB — CYTOLOGY - PAP

## 2017-09-19 LAB — COMPREHENSIVE METABOLIC PANEL
ALBUMIN: 4.8 g/dL (ref 3.5–5.5)
ALK PHOS: 83 IU/L (ref 39–117)
ALT: 19 IU/L (ref 0–32)
AST: 23 IU/L (ref 0–40)
Albumin/Globulin Ratio: 2 (ref 1.2–2.2)
BUN / CREAT RATIO: 14 (ref 9–23)
BUN: 11 mg/dL (ref 6–24)
Bilirubin Total: 0.4 mg/dL (ref 0.0–1.2)
CO2: 23 mmol/L (ref 20–29)
CREATININE: 0.79 mg/dL (ref 0.57–1.00)
Calcium: 10 mg/dL (ref 8.7–10.2)
Chloride: 94 mmol/L — ABNORMAL LOW (ref 96–106)
GFR calc Af Amer: 100 mL/min/{1.73_m2} (ref >59)
GFR calc non Af Amer: 86 mL/min/{1.73_m2} (ref >59)
GLUCOSE: 104 mg/dL — AB (ref 65–99)
Globulin, Total: 2.4 g/dL (ref 1.5–4.5)
Potassium: 4.5 mmol/L (ref 3.5–5.2)
Sodium: 134 mmol/L (ref 134–144)
Total Protein: 7.2 g/dL (ref 6.0–8.5)

## 2017-09-19 LAB — VITAMIN D 25 HYDROXY (VIT D DEFICIENCY, FRACTURES): VIT D 25 HYDROXY: 25.6 ng/mL — AB (ref 30.0–100.0)

## 2017-09-19 LAB — TSH: TSH: 0.716 u[IU]/mL (ref 0.450–4.500)

## 2017-09-20 ENCOUNTER — Encounter: Payer: Self-pay | Admitting: *Deleted

## 2017-09-21 ENCOUNTER — Encounter: Payer: BLUE CROSS/BLUE SHIELD | Admitting: Obstetrics and Gynecology

## 2017-11-10 IMAGING — MG MM DIGITAL SCREENING BILAT W/ TOMO W/ CAD
8 of 12 series · 8 of 28 positions shown · non-contrast
Comparison: Previous exam(s).

CLINICAL DATA: Screening.

EXAM:
2D DIGITAL SCREENING BILATERAL MAMMOGRAM WITH CAD AND ADJUNCT TOMO

[R MLO synth-2D]
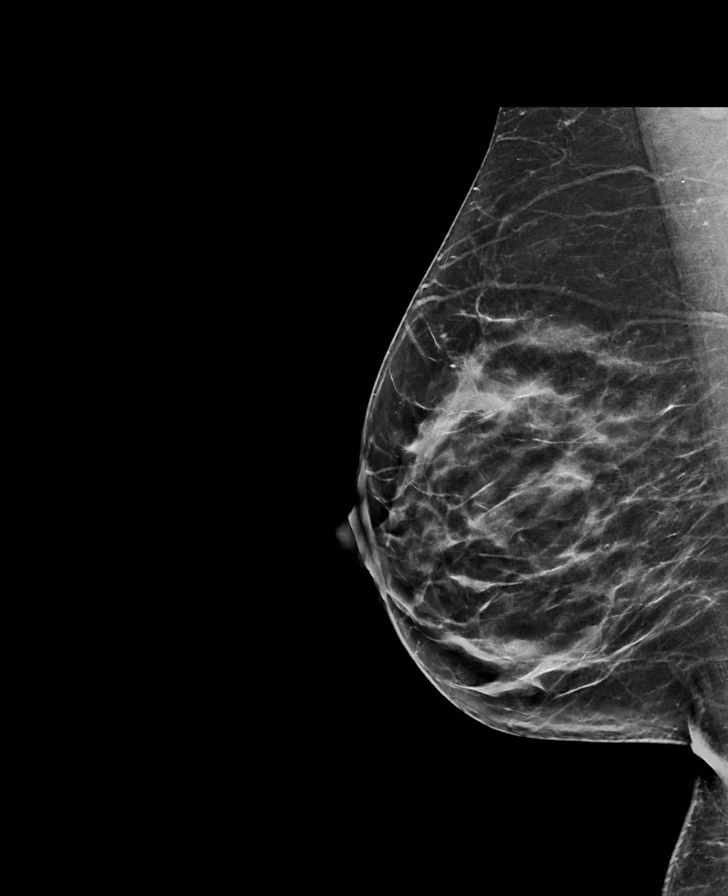

[R MLO]
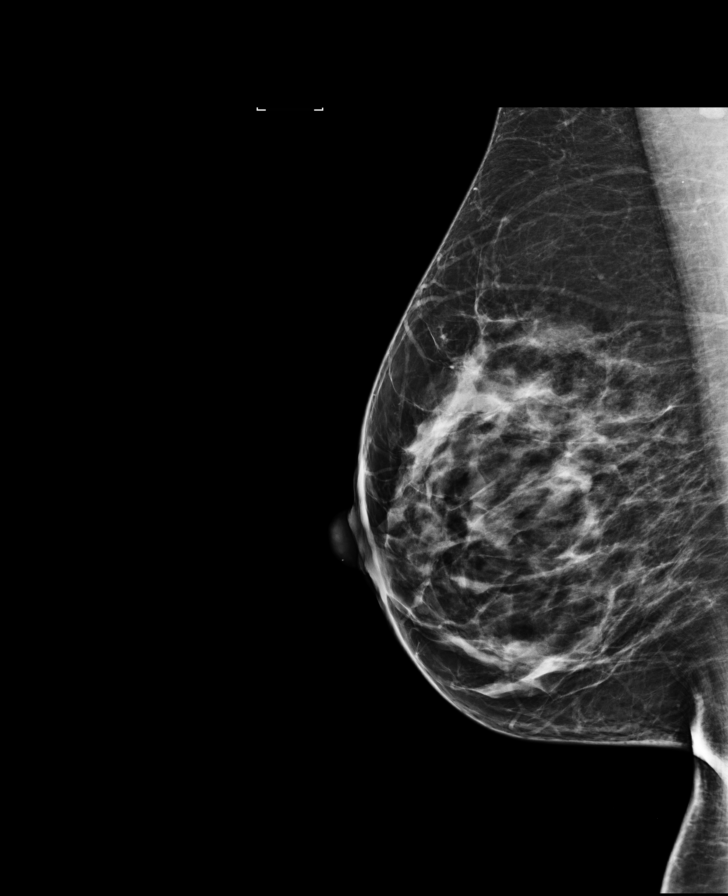

[L CC synth-2D]
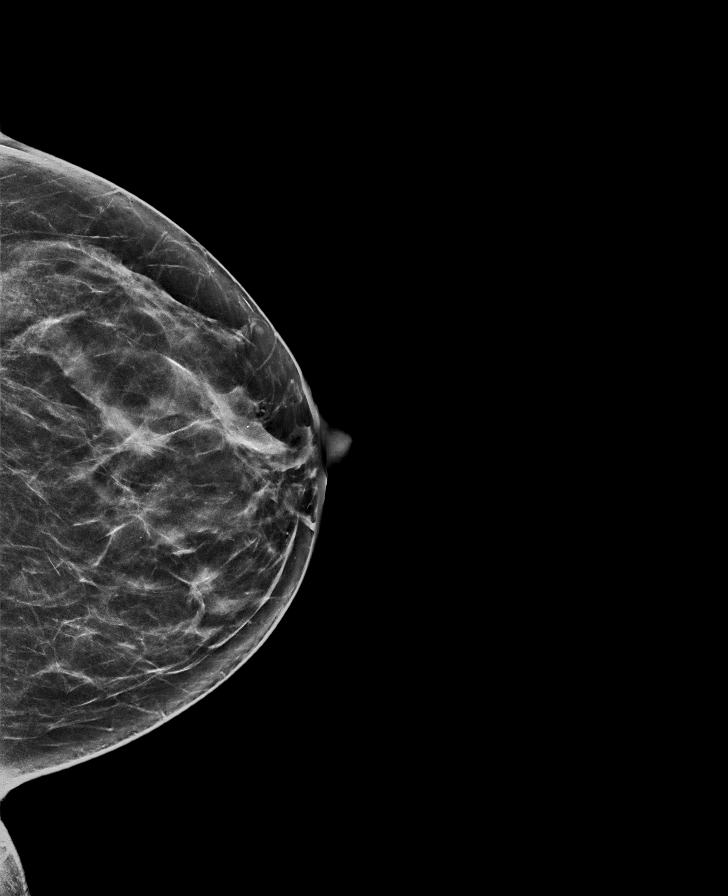

[R CC]
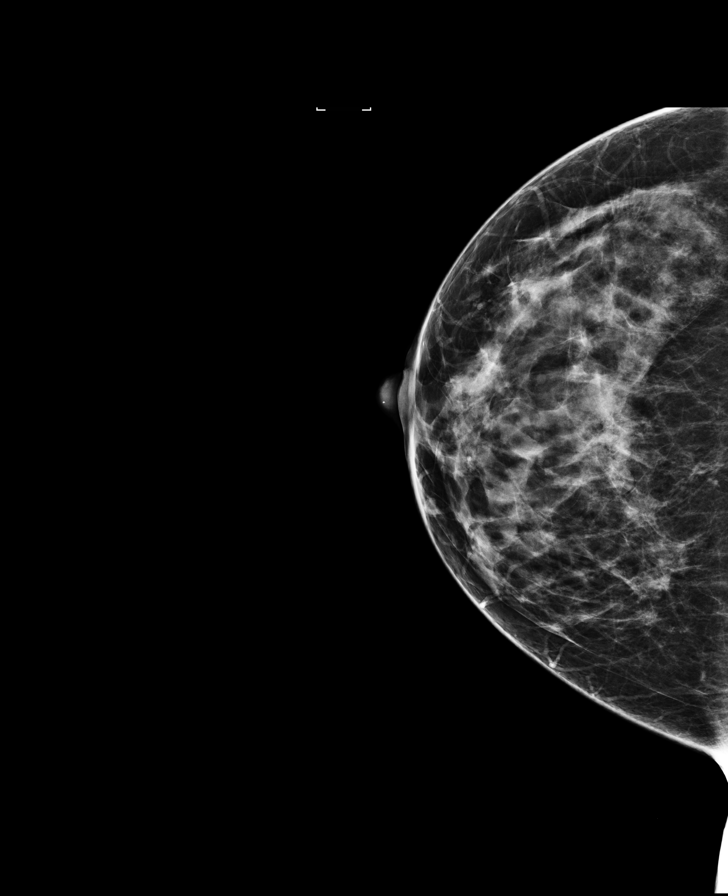

[L CC]
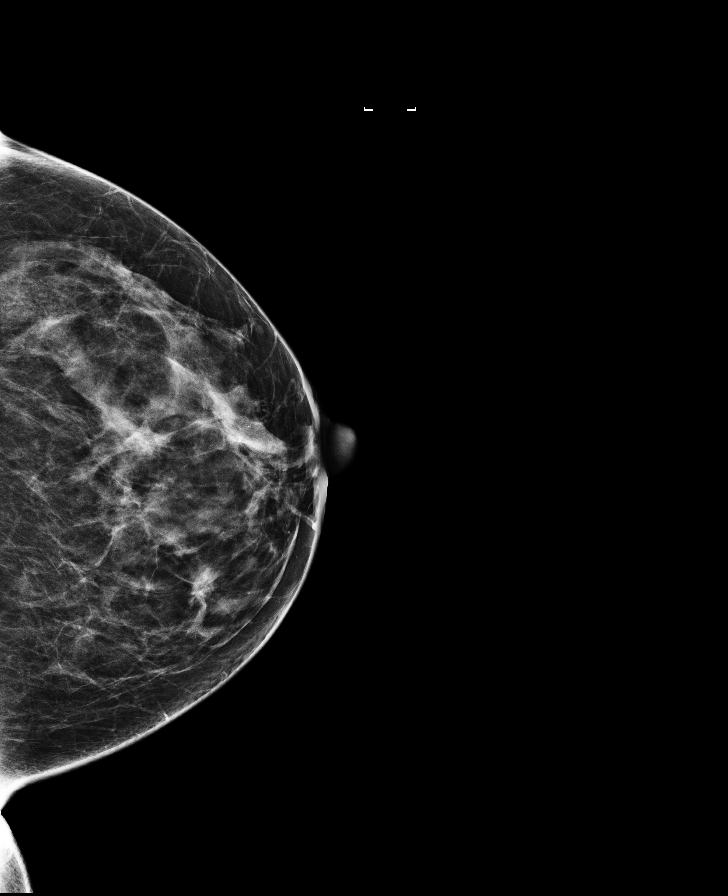

[L MLO]
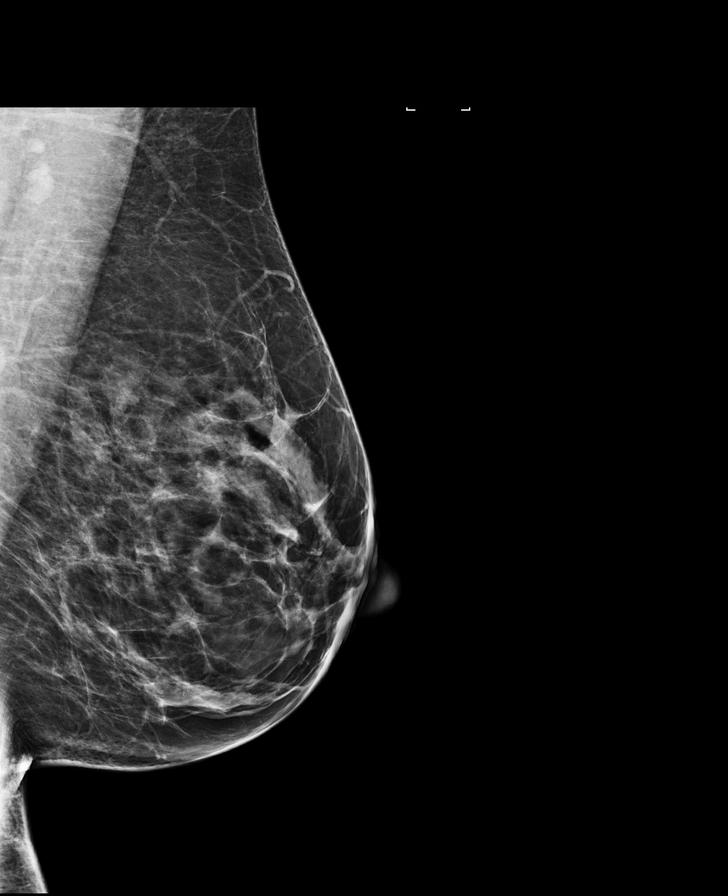

[L MLO synth-2D]
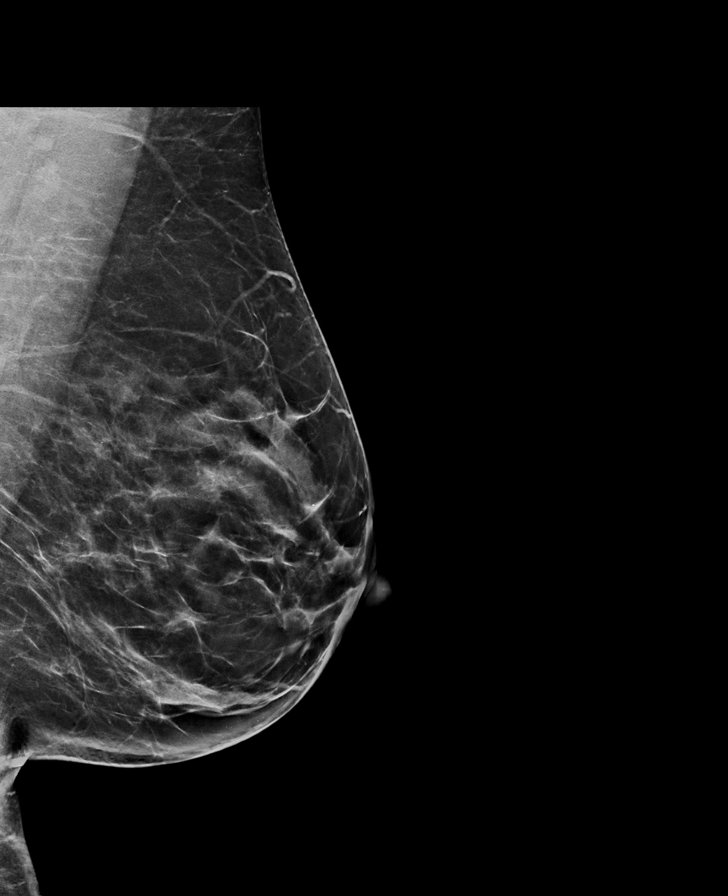

[R CC synth-2D]
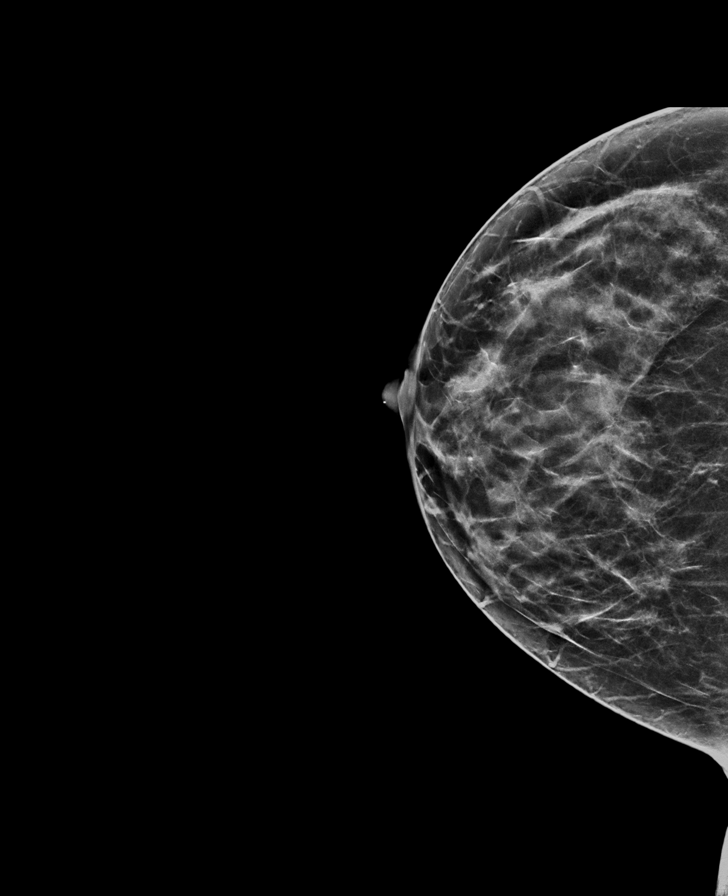

[8 of 28 positions shown; findings below may reference images not displayed]

ACR Breast Density Category c: The breast tissue is heterogeneously
dense, which may obscure small masses.
FINDINGS: There are no findings suspicious for malignancy. Images were
processed with CAD.
IMPRESSION: No mammographic evidence of malignancy. A result letter of this
screening mammogram will be mailed directly to the patient.

RECOMMENDATION:
Screening mammogram in one year. (Code:[id number])

BI-RADS CATEGORY  1: Negative.

## 2017-12-06 ENCOUNTER — Encounter

## 2017-12-06 ENCOUNTER — Encounter: Payer: BLUE CROSS/BLUE SHIELD | Admitting: Obstetrics and Gynecology

## 2017-12-24 ENCOUNTER — Ambulatory Visit: Payer: BLUE CROSS/BLUE SHIELD | Admitting: Family Medicine

## 2017-12-24 ENCOUNTER — Encounter: Payer: Self-pay | Admitting: Family Medicine

## 2017-12-24 VITALS — BP 136/86 | HR 86 | Temp 98.6°F | Wt 202.1 lb

## 2017-12-24 DIAGNOSIS — E039 Hypothyroidism, unspecified: Secondary | ICD-10-CM

## 2017-12-24 DIAGNOSIS — E559 Vitamin D deficiency, unspecified: Secondary | ICD-10-CM | POA: Diagnosis not present

## 2017-12-24 DIAGNOSIS — E782 Mixed hyperlipidemia: Secondary | ICD-10-CM | POA: Diagnosis not present

## 2017-12-24 DIAGNOSIS — I129 Hypertensive chronic kidney disease with stage 1 through stage 4 chronic kidney disease, or unspecified chronic kidney disease: Secondary | ICD-10-CM | POA: Diagnosis not present

## 2017-12-24 MED ORDER — HYDROCHLOROTHIAZIDE 25 MG PO TABS: 25.0000 mg | ORAL_TABLET | Freq: Every day | ORAL | 1 refills | Status: DC

## 2017-12-24 NOTE — Assessment & Plan Note (Signed)
Rechecking levels today. Await results. Call with any concerns.  

## 2017-12-24 NOTE — Progress Notes (Signed)
BP 136/86 (BP Location: Left Arm, Patient Position: Sitting, Cuff Size: Large)   Pulse 86   Temp 98.6 F (37 C)   Wt 202 lb 2 oz (91.7 kg)   SpO2 99%   BMI 34.69 kg/m    Subjective:    Patient ID: Angela Boone, female    DOB: 1966/01/03, 52 y.o.   MRN: [id number]  HPI: Angela Boone is a 52 y.o. female  Chief Complaint  Patient presents with  . Hypertension   HYPERTENSION / HYPERLIPIDEMIA Satisfied with current treatment? yes Duration of hypertension: chronic BP monitoring frequency: not checking BP medication side effects: no Past BP meds: HCTZ Duration of hyperlipidemia: chronic Cholesterol medication side effects: Not on anything Cholesterol supplements: none Past cholesterol medications: none Medication compliance: excellent compliance Aspirin: no Recent stressors: no Recurrent headaches: no Visual changes: no Palpitations: no Dyspnea: no Chest pain: no Lower extremity edema: no Dizzy/lightheaded: no  HYPOTHYROIDISM Thyroid control status:controlled Satisfied with current treatment? yes Medication side effects: no Medication compliance: excellent compliance Recent dose adjustment:no Fatigue: no Cold intolerance: no Heat intolerance: no Weight gain: no Weight loss: no Constipation: no Diarrhea/loose stools: no Palpitations: no Lower extremity edema: no Anxiety/depressed mood: no  Relevant past medical, surgical, family and social history reviewed and updated as indicated. Interim medical history since our last visit reviewed. Allergies and medications reviewed and updated.  Review of Systems  Constitutional: Negative.   Respiratory: Negative.   Cardiovascular: Negative.   Psychiatric/Behavioral: Negative.     Per HPI unless specifically indicated above     Objective:    BP 136/86 (BP Location: Left Arm, Patient Position: Sitting, Cuff Size: Large)   Pulse 86   Temp 98.6 F (37 C)   Wt 202 lb 2 oz (91.7 kg)   SpO2 99%    BMI 34.69 kg/m   Wt Readings from Last 3 Encounters:  12/24/17 202 lb 2 oz (91.7 kg)  09/18/17 199 lb 6.4 oz (90.4 kg)  08/23/17 208 lb 6.4 oz (94.5 kg)    Physical Exam  Constitutional: She is oriented to person, place, and time. She appears well-developed and well-nourished. No distress.  HENT:  Head: Normocephalic and atraumatic.  Right Ear: Hearing normal.  Left Ear: Hearing normal.  Nose: Nose normal.  Eyes: Conjunctivae and lids are normal. Right eye exhibits no discharge. Left eye exhibits no discharge. No scleral icterus.  Cardiovascular: Normal rate, regular rhythm, normal heart sounds and intact distal pulses. Exam reveals no gallop and no friction rub.  No murmur heard. Pulmonary/Chest: Effort normal and breath sounds normal. No stridor. No respiratory distress. She has no wheezes. She has no rales. She exhibits no tenderness.  Musculoskeletal: Normal range of motion. She exhibits no edema, tenderness or deformity.  Neurological: She is alert and oriented to person, place, and time.  Skin: Skin is warm, dry and intact. Capillary refill takes less than 2 seconds. No rash noted. She is not diaphoretic. No erythema. No pallor.  Psychiatric: She has a normal mood and affect. Her speech is normal and behavior is normal. Judgment and thought content normal. Cognition and memory are normal.  Nursing note and vitals reviewed.   Results for orders placed or performed in visit on 09/18/17  Cytology - PAP  Result Value Ref Range   CYTOLOGY - PAP PAP RESULT       Assessment & Plan:   Problem List Items Addressed This Visit      Endocrine   Hypothyroidism - Primary  Rechecking levels today. Will adjust dose as needed. Call with any concerns.       Relevant Orders   Comprehensive metabolic panel   TSH     Genitourinary   Benign hypertensive renal disease    Under good control on current regimen. Continue current regimen. Continue to monitor. Call with any concerns.  Refills given.        Relevant Orders   Comprehensive metabolic panel     Other   Vitamin D deficiency    Rechecking levels today. Await results. Call with any concerns.       Relevant Orders   Comprehensive metabolic panel   VITAMIN D 25 Hydroxy (Vit-D Deficiency, Fractures)   Hyperlipidemia    Not on any medicine. Rechecking levels today. Await results. Call with any concerns.       Relevant Medications   hydrochlorothiazide (HYDRODIURIL) 25 MG tablet   Other Relevant Orders   Comprehensive metabolic panel   Lipid Panel w/o Chol/HDL Ratio       Follow up plan: Return in about 6 months (around 06/26/2018) for Physical.

## 2017-12-24 NOTE — Assessment & Plan Note (Signed)
Rechecking levels today. Will adjust dose as needed. Call with any concerns.  

## 2017-12-24 NOTE — Assessment & Plan Note (Signed)
Not on any medicine. Rechecking levels today. Await results. Call with any concerns.

## 2017-12-24 NOTE — Assessment & Plan Note (Signed)
Under good control on current regimen. Continue current regimen. Continue to monitor. Call with any concerns. Refills given.   

## 2017-12-25 LAB — COMPREHENSIVE METABOLIC PANEL
ALT: 21 IU/L (ref 0–32)
AST: 20 IU/L (ref 0–40)
Albumin/Globulin Ratio: 1.7 (ref 1.2–2.2)
Albumin: 4.5 g/dL (ref 3.5–5.5)
Alkaline Phosphatase: 83 IU/L (ref 39–117)
BILIRUBIN TOTAL: 0.3 mg/dL (ref 0.0–1.2)
BUN/Creatinine Ratio: 15 (ref 9–23)
BUN: 13 mg/dL (ref 6–24)
CHLORIDE: 95 mmol/L — AB (ref 96–106)
CO2: 23 mmol/L (ref 20–29)
Calcium: 9.4 mg/dL (ref 8.7–10.2)
Creatinine, Ser: 0.88 mg/dL (ref 0.57–1.00)
GFR, EST AFRICAN AMERICAN: 87 mL/min/{1.73_m2} (ref >59)
GFR, EST NON AFRICAN AMERICAN: 76 mL/min/{1.73_m2} (ref >59)
GLOBULIN, TOTAL: 2.6 g/dL (ref 1.5–4.5)
Glucose: 71 mg/dL (ref 65–99)
POTASSIUM: 4 mmol/L (ref 3.5–5.2)
SODIUM: 135 mmol/L (ref 134–144)
Total Protein: 7.1 g/dL (ref 6.0–8.5)

## 2017-12-25 LAB — LIPID PANEL W/O CHOL/HDL RATIO
Cholesterol, Total: 190 mg/dL (ref 100–199)
HDL: 58 mg/dL (ref >39)
LDL Calculated: 104 mg/dL — ABNORMAL HIGH (ref 0–99)
Triglycerides: 140 mg/dL (ref 0–149)
VLDL Cholesterol Cal: 28 mg/dL (ref 5–40)

## 2017-12-25 LAB — VITAMIN D 25 HYDROXY (VIT D DEFICIENCY, FRACTURES): VIT D 25 HYDROXY: 33.4 ng/mL (ref 30.0–100.0)

## 2017-12-25 LAB — TSH: TSH: 0.451 u[IU]/mL (ref 0.450–4.500)

## 2018-01-18 DIAGNOSIS — D2262 Melanocytic nevi of left upper limb, including shoulder: Secondary | ICD-10-CM | POA: Diagnosis not present

## 2018-01-18 DIAGNOSIS — D485 Neoplasm of uncertain behavior of skin: Secondary | ICD-10-CM | POA: Diagnosis not present

## 2018-01-18 DIAGNOSIS — L718 Other rosacea: Secondary | ICD-10-CM | POA: Diagnosis not present

## 2018-01-18 DIAGNOSIS — D225 Melanocytic nevi of trunk: Secondary | ICD-10-CM | POA: Diagnosis not present

## 2018-01-18 DIAGNOSIS — D2261 Melanocytic nevi of right upper limb, including shoulder: Secondary | ICD-10-CM | POA: Diagnosis not present

## 2018-01-18 DIAGNOSIS — D2362 Other benign neoplasm of skin of left upper limb, including shoulder: Secondary | ICD-10-CM | POA: Diagnosis not present

## 2018-01-20 ENCOUNTER — Other Ambulatory Visit: Payer: Self-pay | Admitting: Family Medicine

## 2018-01-20 NOTE — Telephone Encounter (Signed)
Years supply sent in in February- should not be due.

## 2018-01-21 NOTE — Telephone Encounter (Signed)
Left message on machine to notify patient to call pharmacy and speak to representative.

## 2018-01-21 NOTE — Telephone Encounter (Signed)
[facility] called and spoke to [place], [facility] about Levothyroxine. I advised it was sent to [facility] on 06/26/17 #90/4 refills and we received a automatic [facility] to [facility]ill from [facility]. She says [facility] will [facility] and text the patient to let her know.

## 2018-01-27 ENCOUNTER — Other Ambulatory Visit: Payer: Self-pay | Admitting: Obstetrics and Gynecology

## 2018-02-04 DIAGNOSIS — L439 Lichen planus, unspecified: Secondary | ICD-10-CM | POA: Diagnosis not present

## 2018-08-09 ENCOUNTER — Other Ambulatory Visit: Payer: Self-pay | Admitting: Family Medicine

## 2018-08-09 NOTE — Telephone Encounter (Signed)
Requested Prescriptions  Pending Prescriptions Disp Refills  . hydrochlorothiazide (HYDRODIURIL) 25 MG tablet [Pharmacy Med Name: HYDROCHLOROTHIAZIDE 25 MG TAB] 90 tablet 1    Sig: TAKE ONE TABLET BY MOUTH DAILY.     Cardiovascular: Diuretics - Thiazide Failed - 08/09/2018 12:34 PM      Failed - Valid encounter within last 6 months    Recent Outpatient Visits          7 months ago Hypothyroidism, unspecified type   [facility] [place], Megan P, DO   1 year ago Routine general medical examination at a health care facility   [facility], [place], DO   1 year ago Essential hypertension   [facility] [place], [place], DO   2 years ago Routine general medical examination at a health care facility   [facility], [place], DO   3 years ago Essential hypertension   [facility] [place], Megan P, DO      Future Appointments            In 4 weeks La[place], Megan P, DO [facility]           Passed - Ca in normal range and within 360 days    Calcium  Date Value Ref Range Status  12/24/2017 9.4 8.7 - 10.2 mg/dL Final         Passed - Cr in normal range and within 360 days    Creatinine, Ser  Date Value Ref Range Status  12/24/2017 0.88 0.57 - 1.00 mg/dL Final         Passed - K in normal range and within 360 days    Potassium  Date Value Ref Range Status  12/24/2017 4.0 3.5 - 5.2 mmol/L Final         Passed - Na in normal range and within 360 days    Sodium  Date Value Ref Range Status  12/24/2017 135 134 - 144 mmol/L Final         Passed - Last BP in normal range    BP Readings from Last 1 Encounters:  12/24/17 136/86

## 2018-09-06 ENCOUNTER — Encounter: Payer: BLUE CROSS/BLUE SHIELD | Admitting: Family Medicine

## 2018-10-09 ENCOUNTER — Other Ambulatory Visit: Payer: Self-pay | Admitting: Family Medicine

## 2018-10-09 ENCOUNTER — Other Ambulatory Visit: Payer: Self-pay | Admitting: Obstetrics and Gynecology

## 2018-10-09 DIAGNOSIS — Z1231 Encounter for screening mammogram for malignant neoplasm of breast: Secondary | ICD-10-CM

## 2018-10-10 ENCOUNTER — Other Ambulatory Visit: Payer: Self-pay | Admitting: Family Medicine

## 2018-10-14 ENCOUNTER — Ambulatory Visit
Admission: RE | Admit: 2018-10-14 | Discharge: 2018-10-14 | Disposition: A | Discharge status: Home / Self Care | Payer: BC Managed Care – PPO | Source: Ambulatory Visit | Attending: Obstetrics and Gynecology | Admitting: Obstetrics and Gynecology

## 2018-10-14 ENCOUNTER — Other Ambulatory Visit: Payer: Self-pay

## 2018-10-14 DIAGNOSIS — Z1231 Encounter for screening mammogram for malignant neoplasm of breast: Secondary | ICD-10-CM | POA: Diagnosis not present

## 2018-10-15 ENCOUNTER — Other Ambulatory Visit: Payer: Self-pay | Admitting: Family Medicine

## 2018-11-13 DIAGNOSIS — S82002A Unspecified fracture of left patella, initial encounter for closed fracture: Secondary | ICD-10-CM | POA: Diagnosis not present

## 2018-12-05 ENCOUNTER — Telehealth: Payer: Self-pay | Admitting: Family Medicine

## 2018-12-05 NOTE — Telephone Encounter (Signed)
Called pt to reschedule 8/14 appt no answer left vm

## 2018-12-13 ENCOUNTER — Encounter: Payer: BLUE CROSS/BLUE SHIELD | Admitting: Family Medicine

## 2018-12-26 ENCOUNTER — Encounter: Payer: Self-pay | Admitting: Family Medicine

## 2019-02-27 DIAGNOSIS — M2011 Hallux valgus (acquired), right foot: Secondary | ICD-10-CM | POA: Diagnosis not present

## 2019-02-27 DIAGNOSIS — Q66211 Congenital metatarsus primus varus, right foot: Secondary | ICD-10-CM | POA: Diagnosis not present

## 2019-02-27 DIAGNOSIS — M25571 Pain in right ankle and joints of right foot: Secondary | ICD-10-CM | POA: Diagnosis not present

## 2019-04-30 DIAGNOSIS — M25571 Pain in right ankle and joints of right foot: Secondary | ICD-10-CM | POA: Diagnosis not present

## 2019-04-30 DIAGNOSIS — M357 Hypermobility syndrome: Secondary | ICD-10-CM | POA: Diagnosis not present

## 2019-04-30 DIAGNOSIS — M2011 Hallux valgus (acquired), right foot: Secondary | ICD-10-CM | POA: Diagnosis not present

## 2019-05-07 DIAGNOSIS — Z20828 Contact with and (suspected) exposure to other viral communicable diseases: Secondary | ICD-10-CM | POA: Diagnosis not present

## 2019-05-07 DIAGNOSIS — M2011 Hallux valgus (acquired), right foot: Secondary | ICD-10-CM | POA: Diagnosis not present

## 2019-05-09 DIAGNOSIS — M2011 Hallux valgus (acquired), right foot: Secondary | ICD-10-CM | POA: Diagnosis not present

## 2019-05-09 DIAGNOSIS — M357 Hypermobility syndrome: Secondary | ICD-10-CM | POA: Diagnosis not present

## 2019-05-09 DIAGNOSIS — Q66211 Congenital metatarsus primus varus, right foot: Secondary | ICD-10-CM | POA: Diagnosis not present

## 2019-05-22 DIAGNOSIS — M7989 Other specified soft tissue disorders: Secondary | ICD-10-CM | POA: Diagnosis not present

## 2019-05-22 DIAGNOSIS — Z4889 Encounter for other specified surgical aftercare: Secondary | ICD-10-CM | POA: Diagnosis not present

## 2019-05-22 DIAGNOSIS — Z4802 Encounter for removal of sutures: Secondary | ICD-10-CM | POA: Diagnosis not present

## 2019-05-22 DIAGNOSIS — M2011 Hallux valgus (acquired), right foot: Secondary | ICD-10-CM | POA: Diagnosis not present

## 2019-06-12 DIAGNOSIS — Z4889 Encounter for other specified surgical aftercare: Secondary | ICD-10-CM | POA: Diagnosis not present

## 2019-07-08 DIAGNOSIS — R300 Dysuria: Secondary | ICD-10-CM | POA: Diagnosis not present

## 2019-07-09 ENCOUNTER — Encounter: Payer: Self-pay | Admitting: Family Medicine

## 2019-07-09 ENCOUNTER — Other Ambulatory Visit: Payer: Self-pay

## 2019-07-09 ENCOUNTER — Telehealth (INDEPENDENT_AMBULATORY_CARE_PROVIDER_SITE_OTHER): Payer: Self-pay | Admitting: Family Medicine

## 2019-07-09 DIAGNOSIS — R490 Dysphonia: Secondary | ICD-10-CM

## 2019-07-09 MED ORDER — OMEPRAZOLE 20 MG PO CPDR: 20.0000 mg | DELAYED_RELEASE_CAPSULE | Freq: Every day | ORAL | 3 refills | Status: DC

## 2019-07-09 NOTE — Progress Notes (Signed)
There were no vitals taken for this visit.   Subjective:    Patient ID: Angela Boone, female    DOB: 1965/06/22, 54 y.o.   MRN: [id number]  HPI: Angela Boone is a 54 y.o. female  Chief Complaint  Patient presents with  . Hoarse    Patient states she's been hoarse for weeks. Patient states she hasn't been sick and don't know what'd contributing to it.   Chaia presents today with concerns about her voice. She states that she has been hoarse for weeks. She cannot find a reason behind it. Hasn't been sick. No URI symptoms. No allergy symptoms, No SOB, no runny nose. She's generally been feeling well. She has not had any GI upset or heartburn. She has had no changes in her job- she talks a lot for it, but that hasn't changed. No vocal trauma. She does notes that it has been very dry in the house, but it doesn't change throughout the day or if she takes a shower. She started zyrtec a couple of weeks ago without any benefit. No other concerns or complaints at this time.   Relevant past medical, surgical, family and social history reviewed and updated as indicated. Interim medical history since our last visit reviewed. Allergies and medications reviewed and updated.  Review of Systems  Constitutional: Negative.   HENT: Positive for voice change. Negative for congestion, dental problem, drooling, ear discharge, ear pain, facial swelling, hearing loss, mouth sores, nosebleeds, postnasal drip, rhinorrhea, sinus pressure, sinus pain, sneezing, sore throat, tinnitus and trouble swallowing.   Respiratory: Negative.   Cardiovascular: Negative.   Gastrointestinal: Negative.   Psychiatric/Behavioral: Negative.     Per HPI unless specifically indicated above     Objective:    There were no vitals taken for this visit.  Wt Readings from Last 3 Encounters:  12/24/17 202 lb 2 oz (91.7 kg)  09/18/17 199 lb 6.4 oz (90.4 kg)  08/23/17 208 lb 6.4 oz (94.5 kg)    Physical Exam Vitals  and nursing note reviewed.  Constitutional:      General: She is not in acute distress.    Appearance: Normal appearance. She is not ill-appearing, toxic-appearing or diaphoretic.  HENT:     Head: Normocephalic and atraumatic.     Right Ear: External ear normal.     Left Ear: External ear normal.     Nose: Nose normal.     Mouth/Throat:     Mouth: Mucous membranes are moist.     Pharynx: Oropharynx is clear.  Eyes:     General: No scleral icterus.       Right eye: No discharge.        Left eye: No discharge.     Conjunctiva/sclera: Conjunctivae normal.     Pupils: Pupils are equal, round, and reactive to light.  Pulmonary:     Effort: Pulmonary effort is normal. No respiratory distress.     Comments: Speaking in full sentences Musculoskeletal:        General: Normal range of motion.     Cervical back: Normal range of motion.  Skin:    Coloration: Skin is not jaundiced or pale.     Findings: No bruising, erythema, lesion or rash.  Neurological:     Mental Status: She is alert and oriented to person, place, and time. Mental status is at baseline.  Psychiatric:        Mood and Affect: Mood normal.  Behavior: Behavior normal.        Thought Content: Thought content normal.        Judgment: Judgment normal.     Results for orders placed or performed in visit on 12/24/17  Comprehensive metabolic panel  Result Value Ref Range   Glucose 71 65 - 99 mg/dL   BUN 13 6 - 24 mg/dL   Creatinine, Ser 0.88 0.57 - 1.00 mg/dL   GFR calc non Af Amer 76 >59 mL/min/1.73   GFR calc Af Amer 87 >59 mL/min/1.73   BUN/Creatinine Ratio 15 9 - 23   Sodium 135 134 - 144 mmol/L   Potassium 4.0 3.5 - 5.2 mmol/L   Chloride 95 (L) 96 - 106 mmol/L   CO2 23 20 - 29 mmol/L   Calcium 9.4 8.7 - 10.2 mg/dL   Total Protein 7.1 6.0 - 8.5 g/dL   Albumin 4.5 3.5 - 5.5 g/dL   Globulin, Total 2.6 1.5 - 4.5 g/dL   Albumin/Globulin Ratio 1.7 1.2 - 2.2   Bilirubin Total 0.3 0.0 - 1.2 mg/dL   Alkaline  Phosphatase 83 39 - 117 IU/L   AST 20 0 - 40 IU/L   ALT 21 0 - 32 IU/L  Lipid Panel w/o Chol/HDL Ratio  Result Value Ref Range   Cholesterol, Total 190 100 - 199 mg/dL   Triglycerides 140 0 - 149 mg/dL   HDL 58 >39 mg/dL   VLDL Cholesterol Cal 28 5 - 40 mg/dL   LDL Calculated 104 (H) 0 - 99 mg/dL  TSH  Result Value Ref Range   TSH 0.451 0.450 - 4.500 uIU/mL  VITAMIN D 25 Hydroxy (Vit-D Deficiency, Fractures)  Result Value Ref Range   Vit D, 25-Hydroxy 33.4 30.0 - 100.0 ng/mL      Assessment & Plan:   Problem List Items Addressed This Visit    None    Visit Diagnoses    Hoarseness    -  Primary   Continue zyrtec and start omeprazole. Will get her into see ENT for evaluation. Call with any concerns.    Relevant Orders   Ambulatory referral to ENT       Follow up plan: Return if symptoms worsen or fail to improve.   . This visit was completed via MyChart due to the restrictions of the COVID-19 pandemic. All issues as above were discussed and addressed. Physical exam was done as above through visual confirmation on MyChart. If it was felt that the patient should be evaluated in the office, they were directed there. The patient verbally consented to this visit. . Location of the patient: home . Location of the provider: home . Those involved with this call:  . Provider: Park Liter, DO . CMA: Merilyn Baba, CMA . Front Desk/Registration: Don Perking  . Time spent on call: 15 minutes with patient face to face via video conference. More than 50% of this time was spent in counseling and coordination of care. 23 minutes total spent in review of patient's record and preparation of their chart.

## 2019-07-10 DIAGNOSIS — Z4889 Encounter for other specified surgical aftercare: Secondary | ICD-10-CM | POA: Diagnosis not present

## 2019-07-10 DIAGNOSIS — M2011 Hallux valgus (acquired), right foot: Secondary | ICD-10-CM | POA: Diagnosis not present

## 2019-07-29 DIAGNOSIS — R490 Dysphonia: Secondary | ICD-10-CM | POA: Diagnosis not present

## 2019-07-31 DIAGNOSIS — Z4889 Encounter for other specified surgical aftercare: Secondary | ICD-10-CM | POA: Diagnosis not present

## 2019-07-31 DIAGNOSIS — M25571 Pain in right ankle and joints of right foot: Secondary | ICD-10-CM | POA: Diagnosis not present

## 2019-09-18 DIAGNOSIS — H524 Presbyopia: Secondary | ICD-10-CM | POA: Diagnosis not present

## 2019-11-20 DIAGNOSIS — Z0189 Encounter for other specified special examinations: Secondary | ICD-10-CM | POA: Diagnosis not present

## 2020-01-11 DIAGNOSIS — J019 Acute sinusitis, unspecified: Secondary | ICD-10-CM | POA: Diagnosis not present

## 2020-01-26 ENCOUNTER — Telehealth: Payer: Self-pay | Admitting: Family Medicine

## 2020-01-26 DIAGNOSIS — Z1231 Encounter for screening mammogram for malignant neoplasm of breast: Secondary | ICD-10-CM

## 2020-01-26 NOTE — Telephone Encounter (Signed)
Order in.

## 2020-01-26 NOTE — Telephone Encounter (Signed)
Copied from CRM [id number]. Topic: Referral - Request for Referral >> Jan 26, 2020  2:43 PM Gwenlyn Fudge wrote: Has patient seen PCP for this complaint? Yes.   *If NO, is insurance requiring patient see PCP for this issue before PCP can refer them? Referral for which specialty: Mammogram  Preferred provider/office: [facility] in [place] Reason for referral: Pt is requesting to have her annual mammogram. Please advise.

## 2020-01-26 NOTE — Telephone Encounter (Signed)
Called and LVM letting patient know that the order has been entered for her.

## 2020-02-04 ENCOUNTER — Ambulatory Visit
Admission: RE | Admit: 2020-02-04 | Discharge: 2020-02-04 | Disposition: A | Discharge status: Home / Self Care | Payer: BC Managed Care – PPO | Source: Ambulatory Visit | Attending: Family Medicine | Admitting: Family Medicine

## 2020-02-04 ENCOUNTER — Other Ambulatory Visit: Payer: Self-pay

## 2020-02-04 DIAGNOSIS — Z1231 Encounter for screening mammogram for malignant neoplasm of breast: Secondary | ICD-10-CM | POA: Diagnosis not present

## 2020-02-24 DIAGNOSIS — Z23 Encounter for immunization: Secondary | ICD-10-CM | POA: Diagnosis not present

## 2020-02-24 DIAGNOSIS — Z043 Encounter for examination and observation following other accident: Secondary | ICD-10-CM | POA: Diagnosis not present

## 2020-02-24 DIAGNOSIS — Z713 Dietary counseling and surveillance: Secondary | ICD-10-CM | POA: Diagnosis not present

## 2020-03-08 ENCOUNTER — Encounter: Payer: Self-pay | Admitting: Family Medicine

## 2020-03-08 ENCOUNTER — Ambulatory Visit (INDEPENDENT_AMBULATORY_CARE_PROVIDER_SITE_OTHER): Payer: BC Managed Care – PPO | Admitting: Family Medicine

## 2020-03-08 ENCOUNTER — Other Ambulatory Visit: Payer: Self-pay

## 2020-03-08 VITALS — BP 138/90 | HR 70 | Temp 97.9°F | Ht 62.25 in | Wt 184.8 lb

## 2020-03-08 DIAGNOSIS — E782 Mixed hyperlipidemia: Secondary | ICD-10-CM

## 2020-03-08 DIAGNOSIS — E039 Hypothyroidism, unspecified: Secondary | ICD-10-CM

## 2020-03-08 DIAGNOSIS — Z0000 Encounter for general adult medical examination without abnormal findings: Secondary | ICD-10-CM

## 2020-03-08 DIAGNOSIS — R232 Flushing: Secondary | ICD-10-CM

## 2020-03-08 DIAGNOSIS — E559 Vitamin D deficiency, unspecified: Secondary | ICD-10-CM | POA: Diagnosis not present

## 2020-03-08 DIAGNOSIS — I129 Hypertensive chronic kidney disease with stage 1 through stage 4 chronic kidney disease, or unspecified chronic kidney disease: Secondary | ICD-10-CM

## 2020-03-08 DIAGNOSIS — Z1211 Encounter for screening for malignant neoplasm of colon: Secondary | ICD-10-CM

## 2020-03-08 LAB — URINALYSIS, ROUTINE W REFLEX MICROSCOPIC
Bilirubin, UA: NEGATIVE
Glucose, UA: NEGATIVE
Ketones, UA: NEGATIVE
Nitrite, UA: NEGATIVE
Protein,UA: NEGATIVE
Specific Gravity, UA: 1.015 (ref 1.005–1.030)
Urobilinogen, Ur: 0.2 mg/dL (ref 0.2–1.0)
pH, UA: 7.5 (ref 5.0–7.5)

## 2020-03-08 LAB — MICROSCOPIC EXAMINATION: Bacteria, UA: NONE SEEN

## 2020-03-08 LAB — MICROALBUMIN, URINE WAIVED
Creatinine, Urine Waived: 50 mg/dL (ref 10–300)
Microalb, Ur Waived: 30 mg/L — ABNORMAL HIGH (ref 0–19)

## 2020-03-08 MED ORDER — HYDROCHLOROTHIAZIDE 25 MG PO TABS
25.0000 mg | ORAL_TABLET | Freq: Every day | ORAL | 1 refills | Status: DC
Start: 2020-03-08 — End: 2020-06-10

## 2020-03-08 NOTE — Progress Notes (Signed)
BP 138/90 (BP Location: Left Arm, Cuff Size: Normal)   Pulse 70   Temp 97.9 F (36.6 C)   Ht 5' 2.25" (1.581 m)   Wt 184 lb 12.8 oz (83.8 kg)   SpO2 100%   BMI 33.53 kg/m    Subjective:    Patient ID: Angela Boone, female    DOB: 12-03-1965, 54 y.o.   MRN: [id number]  HPI: Angela Boone is a 54 y.o. female presenting on 03/08/2020 for comprehensive medical examination. Current medical complaints include:  HYPERTENSION / HYPERLIPIDEMIA Satisfied with current treatment? yes Duration of hypertension: chronic BP monitoring frequency: not checking BP medication side effects: no Past BP meds: HCTZ Duration of hyperlipidemia: chronic Cholesterol medication side effects: not on anything Cholesterol supplements: none Past cholesterol medications: none Medication compliance: excellent compliance Aspirin: no Recent stressors: no Recurrent headaches: no Visual changes: no Palpitations: no Dyspnea: no Chest pain: no Lower extremity edema: no Dizzy/lightheaded: no  HYPOTHYROIDISM Thyroid control status:stable Satisfied with current treatment? yes Medication side effects: no Medication compliance: excellent compliance Recent dose adjustment:no Fatigue: no Cold intolerance: no Heat intolerance: no Weight gain: no Weight loss: no Constipation: no Diarrhea/loose stools: no Palpitations: no Lower extremity edema: no Anxiety/depressed mood: no  Menopausal Symptoms: no  Depression Screen done today and results listed below:  Dep[facility] screen [facility] 03/08/2020 06/25/2017 03/14/2016  Decreased Interest 0 0 0  Down, Depressed, Hopeless 0 0 0  PHQ - 2 Score 0 0 0  Altered sleeping - 0 -  Tired, decreased energy - 0 -  Change in appetite - 0 -  Feeling bad or failure about yourself  - 0 -  Trouble concentrating - 0 -  Moving slowly or fidgety/restless - 0 -  Suicidal thoughts - 0 -  PHQ-9 Score - 0 -  Difficult doing work/chores - Not difficult at all -     Past Medical History:  Past Medical History:  Diagnosis Date  . Hypertension   . Hypothyroidism   . Rosacea   . Trigeminal neuralgia     Surgical History:  Past Surgical History:  Procedure Laterality Date  . BUNIONECTOMY    . HAMMER TOE SURGERY Left     Medications:  Current Outpatient Medications on File Prior to Visit  Medication Sig  . levothyroxine (SYNTHROID, LEVOTHROID) 75 MCG tablet Take 1 tablet (75 mcg total) by mouth daily before breakfast.  . OXcarbazepine (TRILEPTAL) 150 MG tablet Take 450-600 mg by mouth See admin instructions. Takes 3 tablets (450mg ) orally in the morning and 4 tablets (600mg ) orally at bedtime.  Marland Kitchen. augmented betamethasone dipropionate (DIPROLENE-AF) 0.05 % cream Apply topically 2 (two) times daily.  . Vitamin D, Ergocalciferol, (DRISDOL) 50000 units CAPS capsule Take 1 capsule (50,000 Units total) by mouth every 7 (seven) days. (Patient not taking: Reported on 03/08/2020)   No current facility-administered medications on file prior to visit.    Allergies:  No Known Allergies  Social History:  Social History   Socioeconomic History  . Marital status: Divorced    Spouse name: Not on file  . Number of children: Not on file  . Years of education: Not on file  . Highest education level: Not on file  Occupational History  . Not on file  Tobacco Use  . Smoking status: Never Smoker  . Smokeless tobacco: Never Used  Vaping Use  . Vaping Use: Never used  Substance and Sexual Activity  . Alcohol use: Yes    Comment: occas  .  Drug use: No  . Sexual activity: Yes    Birth control/protection: None  Other Topics Concern  . Not on file  Social History Narrative  . Not on file   Social Determinants of Health   Financial Resource Strain:   . Difficulty of Paying Living Expenses: Not on file  Food Insecurity:   . Worried About Programme researcher, broadcasting/film/video in the Last Year: Not on file  . Ran Out of Food in the Last Year: Not on file   Transportation Needs:   . Lack of Transportation (Medical): Not on file  . Lack of Transportation (Non-Medical): Not on file  Physical Activity:   . Days of Exercise per Week: Not on file  . Minutes of Exercise per Session: Not on file  Stress:   . Feeling of Stress : Not on file  Social Connections:   . Frequency of Communication with Friends and Family: Not on file  . Frequency of Social Gatherings with Friends and Family: Not on file  . Attends Religious Services: Not on file  . Active Member of Clubs or Organizations: Not on file  . Attends Banker Meetings: Not on file  . Marital Status: Not on file  Intimate Partner Violence:   . Fear of Current or Ex-Partner: Not on file  . Emotionally Abused: Not on file  . Physically Abused: Not on file  . Sexually Abused: Not on file   Social History   Tobacco Use  Smoking Status Never Smoker  Smokeless Tobacco Never Used   Social History   Substance and Sexual Activity  Alcohol Use Yes   Comment: occas    Family History:  Family History  Problem Relation Age of Onset  . Cancer Mother        breast  . Hypertension Mother   . Breast cancer Mother 76  . Cancer Father        lung  . Heart disease Father   . Diabetes Son 8       Type 1  . Cancer Maternal Grandmother        breast  . Breast cancer Maternal Grandmother 35  . Cancer Maternal Grandfather        colon  . Heart disease Paternal Grandfather   . Hypertension Brother     Past medical history, surgical history, medications, allergies, family history and social history reviewed with patient today and changes made to appropriate areas of the chart.   Review of Systems  Constitutional: Negative.   HENT: Negative.   Eyes: Negative.   Respiratory: Negative.   Cardiovascular: Negative.   Gastrointestinal: Negative.   Genitourinary: Negative.   Musculoskeletal: Negative.   Skin: Negative.   Neurological: Negative.   Endo/Heme/Allergies:  Negative.   Psychiatric/Behavioral: Negative.     All other ROS negative except what is listed above and in the HPI.      Objective:    BP 138/90 (BP Location: Left Arm, Cuff Size: Normal)   Pulse 70   Temp 97.9 F (36.6 C)   Ht 5' 2.25" (1.581 m)   Wt 184 lb 12.8 oz (83.8 kg)   SpO2 100%   BMI 33.53 kg/m   Wt Readings from Last 3 Encounters:  03/08/20 184 lb 12.8 oz (83.8 kg)  12/24/17 202 lb 2 oz (91.7 kg)  09/18/17 199 lb 6.4 oz (90.4 kg)    Physical Exam Vitals and nursing note reviewed.  Constitutional:      General: She is not  in acute distress.    Appearance: Normal appearance. She is not ill-appearing, toxic-appearing or diaphoretic.  HENT:     Head: Normocephalic and atraumatic.     Right Ear: Tympanic membrane, ear canal and external ear normal. There is no impacted cerumen.     Left Ear: Tympanic membrane, ear canal and external ear normal. There is no impacted cerumen.     Nose: Nose normal. No congestion or rhinorrhea.     Mouth/Throat:     Mouth: Mucous membranes are moist.     Pharynx: Oropharynx is clear. No oropharyngeal exudate or posterior oropharyngeal erythema.  Eyes:     General: No scleral icterus.       Right eye: No discharge.        Left eye: No discharge.     Extraocular Movements: Extraocular movements intact.     Conjunctiva/sclera: Conjunctivae normal.     Pupils: Pupils are equal, round, and reactive to light.  Neck:     Vascular: No carotid bruit.  Cardiovascular:     Rate and Rhythm: Normal rate and regular rhythm.     Pulses: Normal pulses.     Heart sounds: No murmur heard.  No friction rub. No gallop.   Pulmonary:     Effort: Pulmonary effort is normal. No respiratory distress.     Breath sounds: Normal breath sounds. No stridor. No wheezing, rhonchi or rales.  Chest:     Chest wall: No tenderness.  Abdominal:     General: Abdomen is flat. Bowel sounds are normal. There is no distension.     Palpations: Abdomen is soft.  There is no mass.     Tenderness: There is no abdominal tenderness. There is no right CVA tenderness, left CVA tenderness, guarding or rebound.     Hernia: No hernia is present.  Genitourinary:    Comments: Breast and pelvic exams deferred with shared decision making Musculoskeletal:        General: No swelling, tenderness, deformity or signs of injury.     Cervical back: Normal range of motion and neck supple. No rigidity. No muscular tenderness.     Right lower leg: No edema.     Left lower leg: No edema.  Lymphadenopathy:     Cervical: No cervical adenopathy.  Skin:    General: Skin is warm and dry.     Capillary Refill: Capillary refill takes less than 2 seconds.     Coloration: Skin is not jaundiced or pale.     Findings: No bruising, erythema, lesion or rash.  Neurological:     General: No focal deficit present.     Mental Status: She is alert and oriented to person, place, and time. Mental status is at baseline.     Cranial Nerves: No cranial nerve deficit.     Sensory: No sensory deficit.     Motor: No weakness.     Coordination: Coordination normal.     Gait: Gait normal.     Deep Tendon Reflexes: Reflexes normal.  Psychiatric:        Mood and Affect: Mood normal.        Behavior: Behavior normal.        Thought Content: Thought content normal.        Judgment: Judgment normal.     Results for orders placed or performed in visit on 12/24/17  Comprehensive metabolic panel  Result Value Ref Range   Glucose 71 65 - 99 mg/dL   BUN 13 6 -  24 mg/dL   Creatinine, Ser 6.04 0.57 - 1.00 mg/dL   GFR calc non Af Amer 76 >59 mL/min/1.73   GFR calc Af Amer 87 >59 mL/min/1.73   BUN/Creatinine Ratio 15 9 - 23   Sodium 135 134 - 144 mmol/L   Potassium 4.0 3.5 - 5.2 mmol/L   Chloride 95 (L) 96 - 106 mmol/L   CO2 23 20 - 29 mmol/L   Calcium 9.4 8.7 - 10.2 mg/dL   Total Protein 7.1 6.0 - 8.5 g/dL   Albumin 4.5 3.5 - 5.5 g/dL   Globulin, Total 2.6 1.5 - 4.5 g/dL    Albumin/Globulin Ratio 1.7 1.2 - 2.2   Bilirubin Total 0.3 0.0 - 1.2 mg/dL   Alkaline Phosphatase 83 39 - 117 IU/L   AST 20 0 - 40 IU/L   ALT 21 0 - 32 IU/L  Lipid Panel w/o Chol/HDL Ratio  Result Value Ref Range   Cholesterol, Total 190 100 - 199 mg/dL   Triglycerides 540 0 - 149 mg/dL   HDL 58 >98 mg/dL   VLDL Cholesterol Cal 28 5 - 40 mg/dL   LDL Calculated 119 (H) 0 - 99 mg/dL  TSH  Result Value Ref Range   TSH 0.451 0.450 - 4.500 uIU/mL  VITAMIN D 25 Hydroxy (Vit-D Deficiency, Fractures)  Result Value Ref Range   Vit D, 25-Hydroxy 33.4 30.0 - 100.0 ng/mL      Assessment & Plan:   Problem List Items Addressed This Visit      Endocrine   Hypothyroidism    Rechecking labs today. Await results. Treat as needed.       Relevant Orders   CBC with Differential/Platelet   TSH     Genitourinary   Benign hypertensive renal disease    Running a little high- will monitor at home and see if creeping up. Continue current regimen. Continue to monitor. Call with any concerns.       Relevant Orders   CBC with Differential/Platelet   Comprehensive metabolic panel   Microalbumin, Urine Waived     Other   Vitamin D deficiency    Rechecking levels today. Await results. Treat as needed.       Relevant Orders   CBC with Differential/Platelet   VITAMIN D 25 Hydroxy (Vit-D Deficiency, Fractures)   Hyperlipidemia    Rechecking levels today. Await results. Treat as needed.       Relevant Medications   hydrochlorothiazide (HYDRODIURIL) 25 MG tablet   Other Relevant Orders   CBC with Differential/Platelet   Comprehensive metabolic panel   Lipid Panel w/o Chol/HDL Ratio    Other Visit Diagnoses    Routine general medical examination at a health care facility    -  Primary   Vaccines up to date. Screening labs checked today. Pap and mammo up to date. Cologuard ordered. Continue diet and exercise. Call with concerns.     Relevant Orders   CBC with Differential/Platelet    Comprehensive metabolic panel   Lipid Panel w/o Chol/HDL Ratio   Microalbumin, Urine Waived   TSH   Urinalysis, Routine w reflex microscopic   VITAMIN D 25 Hydroxy (Vit-D Deficiency, Fractures)   Hepatitis C Antibody   Hot flashes       Will check labs today. Await results.    Relevant Medications   hydrochlorothiazide (HYDRODIURIL) 25 MG tablet   Other Relevant Orders   Estradiol   LH   FSH   Screening for colon cancer  Cologuard ordered today at patient request. Relative is 2nd degree- and doesn't want to do colonoscopy, so we are aware of risk.   Relevant Orders   Cologuard       Follow up plan: Return in about 6 months (around 09/05/2020).   LABORATORY TESTING:  - Pap smear: up to date  IMMUNIZATIONS:   - Tdap: Tetanus vaccination status reviewed: last tetanus booster within 10 years. - Influenza: Up to date - Pneumovax: Not applicable - Prevnar: Not applicable - COVID: Refused  SCREENING: -Mammogram: Up to date  - Colonoscopy: Ordered today    PATIENT COUNSELING:   Advised to take 1 mg of folate supplement per day if capable of pregnancy.   Sexuality: Discussed sexually transmitted diseases, partner selection, use of condoms, avoidance of unintended pregnancy  and contraceptive alternatives.   Advised to avoid cigarette smoking.  I discussed with the patient that most people either abstain from alcohol or drink within safe limits (<=14/week and <=4 drinks/occasion for males, <=7/weeks and <= 3 drinks/occasion for females) and that the risk for alcohol disorders and other health effects rises proportionally with the number of drinks per week and how often a drinker exceeds daily limits.  Discussed cessation/primary prevention of drug use and availability of treatment for abuse.   Diet: Encouraged to adjust caloric intake to maintain  or achieve ideal body weight, to reduce intake of dietary saturated fat and total fat, to limit sodium intake by avoiding high  sodium foods and not adding table salt, and to maintain adequate dietary potassium and calcium preferably from fresh fruits, vegetables, and low-fat dairy products.    stressed the importance of regular exercise  Injury prevention: Discussed safety belts, safety helmets, smoke detector, smoking near bedding or upholstery.   Dental health: Discussed importance of regular tooth brushing, flossing, and dental visits.    NEXT PREVENTATIVE PHYSICAL DUE IN 1 YEAR. Return in about 6 months (around 09/05/2020).

## 2020-03-08 NOTE — Assessment & Plan Note (Signed)
Running a little high- will monitor at home and see if creeping up. Continue current regimen. Continue to monitor. Call with any concerns.

## 2020-03-08 NOTE — Assessment & Plan Note (Signed)
Rechecking levels today. Await results. Treat as needed.  

## 2020-03-08 NOTE — Assessment & Plan Note (Signed)
Rechecking labs today. Await results. Treat as needed.  °

## 2020-03-08 NOTE — Patient Instructions (Signed)
Health Maintenance, Female Adopting a healthy lifestyle and getting preventive care are important in promoting health and wellness. Ask your health care provider about:  The right schedule for you to have regular tests and exams.  Things you can do on your own to prevent diseases and keep yourself healthy. What should I know about diet, weight, and exercise? Eat a healthy diet   Eat a diet that includes plenty of vegetables, fruits, low-fat dairy products, and lean protein.  Do not eat a lot of foods that are high in solid fats, added sugars, or sodium. Maintain a healthy weight Body mass index (BMI) is used to identify weight problems. It estimates body fat based on height and weight. Your health care provider can help determine your BMI and help you achieve or maintain a healthy weight. Get regular exercise Get regular exercise. This is one of the most important things you can do for your health. Most adults should:  Exercise for at least 150 minutes each week. The exercise should increase your heart rate and make you sweat (moderate-intensity exercise).  Do strengthening exercises at least twice a week. This is in addition to the moderate-intensity exercise.  Spend less time sitting. Even light physical activity can be beneficial. Watch cholesterol and blood lipids Have your blood tested for lipids and cholesterol at 54 years of age, then have this test every 5 years. Have your cholesterol levels checked more often if:  Your lipid or cholesterol levels are high.  You are older than 54 years of age.  You are at high risk for heart disease. What should I know about cancer screening? Depending on your health history and family history, you may need to have cancer screening at various ages. This may include screening for:  Breast cancer.  Cervical cancer.  Colorectal cancer.  Skin cancer.  Lung cancer. What should I know about heart disease, diabetes, and high blood  pressure? Blood pressure and heart disease  High blood pressure causes heart disease and increases the risk of stroke. This is more likely to develop in people who have high blood pressure readings, are of African descent, or are overweight.  Have your blood pressure checked: ? Every 3-5 years if you are 18-39 years of age. ? Every year if you are 40 years old or older. Diabetes Have regular diabetes screenings. This checks your fasting blood sugar level. Have the screening done:  Once every three years after age 40 if you are at a normal weight and have a low risk for diabetes.  More often and at a younger age if you are overweight or have a high risk for diabetes. What should I know about preventing infection? Hepatitis B If you have a higher risk for hepatitis B, you should be screened for this virus. Talk with your health care provider to find out if you are at risk for hepatitis B infection. Hepatitis C Testing is recommended for:  Everyone born from 1945 through 1965.  Anyone with known risk factors for hepatitis C. Sexually transmitted infections (STIs)  Get screened for STIs, including gonorrhea and chlamydia, if: ? You are sexually active and are younger than 54 years of age. ? You are older than 54 years of age and your health care provider tells you that you are at risk for this type of infection. ? Your sexual activity has changed since you were last screened, and you are at increased risk for chlamydia or gonorrhea. Ask your health care provider if   you are at risk.  Ask your health care provider about whether you are at high risk for HIV. Your health care provider may recommend a prescription medicine to help prevent HIV infection. If you choose to take medicine to prevent HIV, you should first get tested for HIV. You should then be tested every 3 months for as long as you are taking the medicine. Pregnancy  If you are about to stop having your period (premenopausal) and  you may become pregnant, seek counseling before you get pregnant.  Take 400 to 800 micrograms (mcg) of folic acid every day if you become pregnant.  Ask for birth control (contraception) if you want to prevent pregnancy. Osteoporosis and menopause Osteoporosis is a disease in which the bones lose minerals and strength with aging. This can result in bone fractures. If you are 65 years old or older, or if you are at risk for osteoporosis and fractures, ask your health care provider if you should:  Be screened for bone loss.  Take a calcium or vitamin D supplement to lower your risk of fractures.  Be given hormone replacement therapy (HRT) to treat symptoms of menopause. Follow these instructions at home: Lifestyle  Do not use any products that contain nicotine or tobacco, such as cigarettes, e-cigarettes, and chewing tobacco. If you need help quitting, ask your health care provider.  Do not use street drugs.  Do not share needles.  Ask your health care provider for help if you need support or information about quitting drugs. Alcohol use  Do not drink alcohol if: ? Your health care provider tells you not to drink. ? You are pregnant, may be pregnant, or are planning to become pregnant.  If you drink alcohol: ? Limit how much you use to 0-1 drink a day. ? Limit intake if you are breastfeeding.  Be aware of how much alcohol is in your drink. In the [place] one drink equals one 12 oz bottle of beer (355 mL), one 5 oz glass of wine (148 mL), or one 1 oz glass of hard liquor (44 mL). General instructions  Schedule regular health, dental, and eye exams.  Stay current with your vaccines.  Tell your health care provider if: ? You often feel depressed. ? You have ever been abused or do not feel safe at home. Summary  Adopting a healthy lifestyle and getting preventive care are important in promoting health and wellness.  Follow your health care provider's instructions about healthy  diet, exercising, and getting tested or screened for diseases.  Follow your health care provider's instructions on monitoring your cholesterol and blood pressure. This information is not intended to replace advice given to you by your health care provider. Make sure you discuss any questions you have with your health care provider. Document Revised: 04/10/2018 Document Reviewed: 04/10/2018 Elsevier Patient Education  2020 [employer].  

## 2020-03-09 ENCOUNTER — Other Ambulatory Visit: Payer: Self-pay | Admitting: Family Medicine

## 2020-03-09 DIAGNOSIS — E871 Hypo-osmolality and hyponatremia: Secondary | ICD-10-CM

## 2020-03-09 LAB — COMPREHENSIVE METABOLIC PANEL
ALT: 24 IU/L (ref 0–32)
AST: 21 IU/L (ref 0–40)
Albumin/Globulin Ratio: 2 (ref 1.2–2.2)
Albumin: 4.8 g/dL (ref 3.8–4.9)
Alkaline Phosphatase: 94 IU/L (ref 44–121)
BUN/Creatinine Ratio: 21 (ref 9–23)
BUN: 14 mg/dL (ref 6–24)
Bilirubin Total: 0.3 mg/dL (ref 0.0–1.2)
CO2: 25 mmol/L (ref 20–29)
Calcium: 9.6 mg/dL (ref 8.7–10.2)
Chloride: 89 mmol/L — ABNORMAL LOW (ref 96–106)
Creatinine, Ser: 0.66 mg/dL (ref 0.57–1.00)
GFR calc Af Amer: 116 mL/min/{1.73_m2} (ref >59)
GFR calc non Af Amer: 100 mL/min/{1.73_m2} (ref >59)
Globulin, Total: 2.4 g/dL (ref 1.5–4.5)
Glucose: 89 mg/dL (ref 65–99)
Potassium: 4.2 mmol/L (ref 3.5–5.2)
Sodium: 127 mmol/L — ABNORMAL LOW (ref 134–144)
Total Protein: 7.2 g/dL (ref 6.0–8.5)

## 2020-03-09 LAB — VITAMIN D 25 HYDROXY (VIT D DEFICIENCY, FRACTURES): Vit D, 25-Hydroxy: 28.7 ng/mL — ABNORMAL LOW (ref 30.0–100.0)

## 2020-03-09 LAB — LIPID PANEL W/O CHOL/HDL RATIO
Cholesterol, Total: 253 mg/dL — ABNORMAL HIGH (ref 100–199)
HDL: 90 mg/dL (ref >39)
LDL Chol Calc (NIH): 138 mg/dL — ABNORMAL HIGH (ref 0–99)
Triglycerides: 146 mg/dL (ref 0–149)
VLDL Cholesterol Cal: 25 mg/dL (ref 5–40)

## 2020-03-09 LAB — CBC WITH DIFFERENTIAL/PLATELET
Basophils Absolute: 0.1 10*3/uL (ref 0.0–0.2)
Basos: 1 %
EOS (ABSOLUTE): 0.1 10*3/uL (ref 0.0–0.4)
Eos: 2 %
Hematocrit: 42.8 % (ref 34.0–46.6)
Hemoglobin: 14.7 g/dL (ref 11.1–15.9)
Immature Grans (Abs): 0 10*3/uL (ref 0.0–0.1)
Immature Granulocytes: 0 %
Lymphocytes Absolute: 1.2 10*3/uL (ref 0.7–3.1)
Lymphs: 20 %
MCH: 31.2 pg (ref 26.6–33.0)
MCHC: 34.3 g/dL (ref 31.5–35.7)
MCV: 91 fL (ref 79–97)
Monocytes Absolute: 0.6 10*3/uL (ref 0.1–0.9)
Monocytes: 10 %
Neutrophils Absolute: 4 10*3/uL (ref 1.4–7.0)
Neutrophils: 67 %
Platelets: 292 10*3/uL (ref 150–450)
RBC: 4.71 x10E6/uL (ref 3.77–5.28)
RDW: 12.5 % (ref 11.7–15.4)
WBC: 6 10*3/uL (ref 3.4–10.8)

## 2020-03-09 LAB — FOLLICLE STIMULATING HORMONE: FSH: 54.3 m[IU]/mL

## 2020-03-09 LAB — TSH: TSH: 0.666 u[IU]/mL (ref 0.450–4.500)

## 2020-03-09 LAB — ESTRADIOL: Estradiol: <5 pg/mL

## 2020-03-09 LAB — HEPATITIS C ANTIBODY: Hep C Virus Ab: <0.1 s/co ratio (ref 0.0–0.9)

## 2020-03-09 LAB — LUTEINIZING HORMONE: LH: 26.5 m[IU]/mL

## 2020-03-09 MED ORDER — VITAMIN D (ERGOCALCIFEROL) 1.25 MG (50000 UNIT) PO CAPS
50000.0000 [IU] | ORAL_CAPSULE | ORAL | 1 refills | Status: DC
Start: 2020-03-09 — End: 2021-08-16

## 2020-03-09 MED ORDER — LEVOTHYROXINE SODIUM 75 MCG PO TABS
75.0000 ug | ORAL_TABLET | Freq: Every day | ORAL | 4 refills | Status: DC
Start: 2020-03-09 — End: 2020-06-10

## 2020-03-10 ENCOUNTER — Other Ambulatory Visit: Payer: Self-pay | Admitting: Family Medicine

## 2020-03-10 DIAGNOSIS — J00 Acute nasopharyngitis [common cold]: Secondary | ICD-10-CM | POA: Diagnosis not present

## 2020-03-10 MED ORDER — ATORVASTATIN CALCIUM 20 MG PO TABS: 20.0000 mg | ORAL_TABLET | Freq: Every day | ORAL | 1 refills | Status: DC

## 2020-03-16 DIAGNOSIS — J019 Acute sinusitis, unspecified: Secondary | ICD-10-CM | POA: Diagnosis not present

## 2020-03-16 DIAGNOSIS — Z20828 Contact with and (suspected) exposure to other viral communicable diseases: Secondary | ICD-10-CM | POA: Diagnosis not present

## 2020-04-01 DIAGNOSIS — J209 Acute bronchitis, unspecified: Secondary | ICD-10-CM | POA: Diagnosis not present

## 2020-04-06 DIAGNOSIS — Z1211 Encounter for screening for malignant neoplasm of colon: Secondary | ICD-10-CM | POA: Diagnosis not present

## 2020-04-06 DIAGNOSIS — Z1212 Encounter for screening for malignant neoplasm of rectum: Secondary | ICD-10-CM | POA: Diagnosis not present

## 2020-04-06 LAB — COLOGUARD: Cologuard: NEGATIVE

## 2020-04-12 LAB — COLOGUARD: COLOGUARD: NEGATIVE

## 2020-04-12 LAB — EXTERNAL GENERIC LAB PROCEDURE: COLOGUARD: NEGATIVE

## 2020-04-13 ENCOUNTER — Encounter: Payer: Self-pay | Admitting: Family Medicine

## 2020-05-06 ENCOUNTER — Telehealth: Payer: Self-pay

## 2020-05-06 ENCOUNTER — Other Ambulatory Visit: Payer: Self-pay

## 2020-05-06 DIAGNOSIS — Z23 Encounter for immunization: Secondary | ICD-10-CM | POA: Diagnosis not present

## 2020-05-06 DIAGNOSIS — Z7189 Other specified counseling: Secondary | ICD-10-CM | POA: Diagnosis not present

## 2020-05-06 NOTE — Telephone Encounter (Signed)
Patient called in say to inform Angela Boone that she did not need any additional medication refilled

## 2020-05-06 NOTE — Telephone Encounter (Signed)
Last rx was sent to [facility] in November, patient never picked up because she thought it was going to be sent to [facility].

## 2020-05-06 NOTE — Telephone Encounter (Signed)
Copied from CRM [id number]. Topic: Quick Communication - See Telephone Encounter >> May 06, 2020 10:52 AM Aretta Nip wrote: CRM for notification. See Telephone encounter for: 05/06/20.atorvastatin (LIPITOR) 20 MG tablet 90 tablet 1 03/10/2020   Sig - Route: Take 1 tablet (20 mg total) by mouth daily. - Oral  Sent to pharmacy as: atorvastatin (LIPITOR) 20 MG tablet  E-Prescribing Status: Receipt confirmed by pharmacy (03/10/2020 11:08 AM EST)  Pt called stating she was to have med for cholesterol called in in Nov, never received / see above med called into [phone] [place], [place] - [address]  Phone:  [phone] Fax:  [phone]

## 2020-05-10 MED ORDER — ATORVASTATIN CALCIUM 20 MG PO TABS
20.0000 mg | ORAL_TABLET | Freq: Every day | ORAL | 1 refills | Status: DC
Start: 2020-05-10 — End: 2020-06-10

## 2020-05-18 DIAGNOSIS — L239 Allergic contact dermatitis, unspecified cause: Secondary | ICD-10-CM | POA: Diagnosis not present

## 2020-06-10 ENCOUNTER — Other Ambulatory Visit: Payer: Self-pay

## 2020-06-10 NOTE — Telephone Encounter (Signed)
Patient needing RX's sent to mail order now.

## 2020-06-12 MED ORDER — LEVOTHYROXINE SODIUM 75 MCG PO TABS
75.0000 ug | ORAL_TABLET | Freq: Every day | ORAL | 3 refills | Status: DC
Start: 2020-06-12 — End: 2021-07-06

## 2020-06-12 MED ORDER — ATORVASTATIN CALCIUM 20 MG PO TABS: 20.0000 mg | ORAL_TABLET | Freq: Every day | ORAL | 0 refills | Status: DC

## 2020-06-12 MED ORDER — HYDROCHLOROTHIAZIDE 25 MG PO TABS
25.0000 mg | ORAL_TABLET | Freq: Every day | ORAL | 0 refills | Status: DC
Start: 2020-06-12 — End: 2020-10-25

## 2020-07-06 DIAGNOSIS — Z0189 Encounter for other specified special examinations: Secondary | ICD-10-CM | POA: Diagnosis not present

## 2020-07-08 DIAGNOSIS — Z713 Dietary counseling and surveillance: Secondary | ICD-10-CM | POA: Diagnosis not present

## 2020-07-08 DIAGNOSIS — Z043 Encounter for examination and observation following other accident: Secondary | ICD-10-CM | POA: Diagnosis not present

## 2020-07-08 DIAGNOSIS — E785 Hyperlipidemia, unspecified: Secondary | ICD-10-CM | POA: Diagnosis not present

## 2020-10-22 ENCOUNTER — Other Ambulatory Visit: Payer: Self-pay | Admitting: Family Medicine

## 2020-10-22 NOTE — Telephone Encounter (Signed)
Lvm to make this physical apt after 03/09/2021

## 2020-10-22 NOTE — Telephone Encounter (Signed)
Notes to clinic: patient had physical on 03/08/2020  Overdue for 6 month follow up Review for refills  Requested Prescriptions  Pending Prescriptions Disp Refills   atorvastatin (LIPITOR) 20 MG tablet [Pharmacy Med Name: ATORVASTATIN TABS 20MG ] 90 tablet 3    Sig: TAKE 1 TABLET DAILY      Cardiovascular:  Antilipid - Statins Failed - 10/22/2020 12:24 PM      Failed - Total Cholesterol in normal range and within 360 days    Cholesterol, Total  Date Value Ref Range Status  03/08/2020 253 (H) 100 - 199 mg/dL Final          Failed - LDL in normal range and within 360 days    LDL Chol Calc (NIH)  Date Value Ref Range Status  03/08/2020 138 (H) 0 - 99 mg/dL Final          Passed - HDL in normal range and within 360 days    HDL  Date Value Ref Range Status  03/08/2020 90 >39 mg/dL Final          Passed - Triglycerides in normal range and within 360 days    Triglycerides  Date Value Ref Range Status  03/08/2020 146 0 - 149 mg/dL Final          Passed - Patient is not pregnant      Passed - Valid encounter within last 12 months    Recent Outpatient Visits           7 months ago Routine general medical examination at a health care facility   [facility], [place], DO   1 year ago Hoarseness   [facility] [place], [place], DO   2 years ago Hypothyroidism, unspecified type   [facility] [place], Megan P, DO   3 years ago Routine general medical examination at a health care facility   [facility], DO   4 years ago Essential hypertension   [facility] [place], Megan P, DO                  hydrochlorothiazide (HYDRODIURIL) 25 MG tablet [Pharmacy Med Name: HYDROCHLOROTHIAZIDE TABS 25MG ] 90 tablet 3    Sig: TAKE 1 TABLET DAILY      Cardiovascular: Diuretics - Thiazide Failed - 10/22/2020 12:24 PM      Failed - Na in normal range and within 360 days    Sodium  Date Value Ref  Range Status  03/08/2020 127 (L) 134 - 144 mmol/L Final          Failed - Last BP in normal range    BP Readings from Last 1 Encounters:  03/08/20 138/90          Failed - Valid encounter within last 6 months    Recent Outpatient Visits           7 months ago Routine general medical examination at a health care facility   [facility] [place], [place], DO   1 year ago Hoarseness   [facility] [place], [place], DO   2 years ago Hypothyroidism, unspecified type   [facility] [place], Megan P, DO   3 years ago Routine general medical examination at a health care facility   [facility] [place], DO   4 years ago Essential hypertension   [facility] [place], [place], DO  Passed - Ca in normal range and within 360 days    Calcium  Date Value Ref Range Status  03/08/2020 9.6 8.7 - 10.2 mg/dL Final          Passed - Cr in normal range and within 360 days    Creatinine, Ser  Date Value Ref Range Status  03/08/2020 0.66 0.57 - 1.00 mg/dL Final          Passed - K in normal range and within 360 days    Potassium  Date Value Ref Range Status  03/08/2020 4.2 3.5 - 5.2 mmol/L Final

## 2020-10-25 NOTE — Telephone Encounter (Signed)
Pt schedule for physical  on 03/15/2021

## 2020-11-03 ENCOUNTER — Other Ambulatory Visit: Payer: Self-pay | Admitting: Family Medicine

## 2020-11-03 DIAGNOSIS — M792 Neuralgia and neuritis, unspecified: Secondary | ICD-10-CM | POA: Diagnosis not present

## 2020-11-22 DIAGNOSIS — J069 Acute upper respiratory infection, unspecified: Secondary | ICD-10-CM | POA: Diagnosis not present

## 2020-11-22 DIAGNOSIS — J0100 Acute maxillary sinusitis, unspecified: Secondary | ICD-10-CM | POA: Diagnosis not present

## 2020-11-26 DIAGNOSIS — R059 Cough, unspecified: Secondary | ICD-10-CM | POA: Diagnosis not present

## 2020-12-30 ENCOUNTER — Other Ambulatory Visit: Payer: Self-pay | Admitting: Family Medicine

## 2020-12-30 DIAGNOSIS — Z1231 Encounter for screening mammogram for malignant neoplasm of breast: Secondary | ICD-10-CM

## 2021-02-17 ENCOUNTER — Ambulatory Visit
Admission: RE | Admit: 2021-02-17 | Discharge: 2021-02-17 | Disposition: A | Discharge status: Home / Self Care | Payer: BC Managed Care – PPO | Source: Ambulatory Visit | Attending: Family Medicine | Admitting: Family Medicine

## 2021-02-17 ENCOUNTER — Ambulatory Visit: Payer: BC Managed Care – PPO

## 2021-02-17 ENCOUNTER — Other Ambulatory Visit: Payer: Self-pay

## 2021-02-17 DIAGNOSIS — Z1231 Encounter for screening mammogram for malignant neoplasm of breast: Secondary | ICD-10-CM | POA: Diagnosis not present

## 2021-03-07 DIAGNOSIS — Z23 Encounter for immunization: Secondary | ICD-10-CM | POA: Diagnosis not present

## 2021-03-15 ENCOUNTER — Encounter: Payer: BC Managed Care – PPO | Admitting: Family Medicine

## 2021-03-16 DIAGNOSIS — Z23 Encounter for immunization: Secondary | ICD-10-CM | POA: Diagnosis not present

## 2021-03-22 DIAGNOSIS — D2271 Melanocytic nevi of right lower limb, including hip: Secondary | ICD-10-CM | POA: Diagnosis not present

## 2021-03-22 DIAGNOSIS — L538 Other specified erythematous conditions: Secondary | ICD-10-CM | POA: Diagnosis not present

## 2021-03-22 DIAGNOSIS — L820 Inflamed seborrheic keratosis: Secondary | ICD-10-CM | POA: Diagnosis not present

## 2021-03-22 DIAGNOSIS — D2272 Melanocytic nevi of left lower limb, including hip: Secondary | ICD-10-CM | POA: Diagnosis not present

## 2021-03-22 DIAGNOSIS — D225 Melanocytic nevi of trunk: Secondary | ICD-10-CM | POA: Diagnosis not present

## 2021-03-22 DIAGNOSIS — D2262 Melanocytic nevi of left upper limb, including shoulder: Secondary | ICD-10-CM | POA: Diagnosis not present

## 2021-03-25 DIAGNOSIS — S8392XA Sprain of unspecified site of left knee, initial encounter: Secondary | ICD-10-CM | POA: Diagnosis not present

## 2021-04-05 ENCOUNTER — Other Ambulatory Visit: Payer: Self-pay | Admitting: Family Medicine

## 2021-04-05 NOTE — Telephone Encounter (Signed)
Upcoming appointment 04/18/21.   Called patient, she will be out of medication before her appointment.

## 2021-04-05 NOTE — Telephone Encounter (Signed)
Requested medication (s) are due for refill today: yes  Requested medication (s) are on the active medication list: yes  Last refill:  01/05/21  Future visit scheduled: 04/18/21  Notes to clinic:  Failed protocol of labs within 360 days, has upcoming appt, please assess.   Requested Prescriptions  Pending Prescriptions Disp Refills   hydrochlorothiazide (HYDRODIURIL) 25 MG tablet [Pharmacy Med Name: HYDROCHLOROTHIAZIDE TABS 25MG ] 90 tablet 3    Sig: TAKE 1 TABLET DAILY     Cardiovascular: Diuretics - Thiazide Failed - 04/05/2021  1:36 AM      Failed - Ca in normal range and within 360 days    Calcium  Date Value Ref Range Status  03/08/2020 9.6 8.7 - 10.2 mg/dL Final          Failed - Cr in normal range and within 360 days    Creatinine, Ser  Date Value Ref Range Status  03/08/2020 0.66 0.57 - 1.00 mg/dL Final          Failed - K in normal range and within 360 days    Potassium  Date Value Ref Range Status  03/08/2020 4.2 3.5 - 5.2 mmol/L Final          Failed - Na in normal range and within 360 days    Sodium  Date Value Ref Range Status  03/08/2020 127 (L) 134 - 144 mmol/L Final          Failed - Last BP in normal range    BP Readings from Last 1 Encounters:  03/08/20 138/90          Failed - Valid encounter within last 6 months    Recent Outpatient Visits           1 year ago Routine general medical examination at a health care facility   [facility] [place], [place], DO   1 year ago Hoarseness   [facility] [place], [place], DO   3 years ago Hypothyroidism, unspecified type   [facility] [place], Megan P, DO   3 years ago Routine general medical examination at a health care facility   [facility], [place], DO   4 years ago Essential hypertension   [facility] [place], [place], DO       Future Appointments             In 1 week Johnson, Megan P, DO [facility],  [facility]             atorvastatin (LIPITOR) 20 MG tablet [Pharmacy Med Name: ATORVASTATIN TABS 20MG ] 90 tablet 3    Sig: TAKE 1 TABLET DAILY     Cardiovascular:  Antilipid - Statins Failed - 04/05/2021  1:36 AM      Failed - Total Cholesterol in normal range and within 360 days    Cholesterol, Total  Date Value Ref Range Status  03/08/2020 253 (H) 100 - 199 mg/dL Final          Failed - LDL in normal range and within 360 days    LDL Chol Calc (NIH)  Date Value Ref Range Status  03/08/2020 138 (H) 0 - 99 mg/dL Final          Failed - HDL in normal range and within 360 days    HDL  Date Value Ref Range Status  03/08/2020 90 >39 mg/dL Final          Failed - Triglycerides in normal  range and within 360 days    Triglycerides  Date Value Ref Range Status  03/08/2020 146 0 - 149 mg/dL Final          Failed - Valid encounter within last 12 months    Recent Outpatient Visits           1 year ago Routine general medical examination at a health care facility   [facility] [place], [place], DO   1 year ago Hoarseness   [facility] [place], [place], DO   3 years ago Hypothyroidism, unspecified type   [facility] [place], Megan P, DO   3 years ago Routine general medical examination at a health care facility   [facility] [place], [place], DO   4 years ago Essential hypertension   [facility] [place], Oralia Rud, DO       Future Appointments             In 1 week Laural Benes, Oralia Rud, DO [facility], [facility]            Passed - Patient is not pregnant

## 2021-04-18 ENCOUNTER — Other Ambulatory Visit (HOSPITAL_COMMUNITY)
Admission: RE | Admit: 2021-04-18 | Discharge: 2021-04-18 | Disposition: A | Discharge status: Home / Self Care | Payer: BC Managed Care – PPO | Source: Ambulatory Visit | Attending: Family Medicine | Admitting: Family Medicine

## 2021-04-18 ENCOUNTER — Encounter: Payer: Self-pay | Admitting: Family Medicine

## 2021-04-18 ENCOUNTER — Ambulatory Visit (INDEPENDENT_AMBULATORY_CARE_PROVIDER_SITE_OTHER): Payer: BC Managed Care – PPO | Admitting: Family Medicine

## 2021-04-18 ENCOUNTER — Other Ambulatory Visit: Payer: Self-pay

## 2021-04-18 VITALS — BP 128/76 | HR 83

## 2021-04-18 DIAGNOSIS — I129 Hypertensive chronic kidney disease with stage 1 through stage 4 chronic kidney disease, or unspecified chronic kidney disease: Secondary | ICD-10-CM

## 2021-04-18 DIAGNOSIS — Z0000 Encounter for general adult medical examination without abnormal findings: Secondary | ICD-10-CM | POA: Insufficient documentation

## 2021-04-18 DIAGNOSIS — M170 Bilateral primary osteoarthritis of knee: Secondary | ICD-10-CM

## 2021-04-18 DIAGNOSIS — E559 Vitamin D deficiency, unspecified: Secondary | ICD-10-CM

## 2021-04-18 DIAGNOSIS — E782 Mixed hyperlipidemia: Secondary | ICD-10-CM

## 2021-04-18 DIAGNOSIS — E039 Hypothyroidism, unspecified: Secondary | ICD-10-CM | POA: Diagnosis not present

## 2021-04-18 MED ORDER — ATORVASTATIN CALCIUM 20 MG PO TABS
20.0000 mg | ORAL_TABLET | Freq: Every day | ORAL | 1 refills | Status: DC
Start: 2021-04-18 — End: 2021-07-05

## 2021-04-18 MED ORDER — HYDROCHLOROTHIAZIDE 25 MG PO TABS
25.0000 mg | ORAL_TABLET | Freq: Every day | ORAL | 1 refills | Status: DC
Start: 2021-04-18 — End: 2021-07-05

## 2021-04-18 NOTE — Assessment & Plan Note (Signed)
Rechecking labs today. Await results. Treat as needed. Call with any concerns.  

## 2021-04-18 NOTE — Assessment & Plan Note (Signed)
Under good control on current regimen. Continue current regimen. Continue to monitor. Call with any concerns. Refills given. Labs drawn today.   

## 2021-04-18 NOTE — Progress Notes (Signed)
BP 128/76    Pulse 83    SpO2 99%    Subjective:    Patient ID: Angela Boone, female    DOB: [birth year], 55 y.o.   MRN: [id number]  HPI: Angela Boone is a 55 y.o. female presenting on 04/18/2021 for comprehensive medical examination. Current medical complaints include:  HYPERTENSION / HYPERLIPIDEMIA Satisfied with current treatment? yes Duration of hypertension: chronic BP monitoring frequency: not checking BP medication side effects: no Past BP meds: HCTZ Duration of hyperlipidemia: chronic Cholesterol medication side effects: no Cholesterol supplements: none Past cholesterol medications: atorvastatin Medication compliance: excellent compliance Aspirin: no Recent stressors: no Recurrent headaches: no Visual changes: no Palpitations: no Dyspnea: no Chest pain: no Lower extremity edema: no Dizzy/lightheaded: no  HYPOTHYROIDISM Thyroid control status:controlled Satisfied with current treatment? yes Medication side effects: no Medication compliance: excellent compliance Recent dose adjustment:no Fatigue: no Cold intolerance: no Heat intolerance: no Weight gain: no Weight loss: no Constipation: no Diarrhea/loose stools: no Palpitations: no Lower extremity edema: no Anxiety/depressed mood: no  Menopausal Symptoms: no  Depression Screen done today and results listed below:  Depression screen [facility] 2/9 04/18/2021 03/08/2020 06/25/2017 03/14/2016  Decreased Interest 0 0 0 0  Down, Depressed, Hopeless 0 0 0 0  PHQ - 2 Score 0 0 0 0  Altered sleeping - - 0 -  Tired, decreased energy - - 0 -  Change in appetite - - 0 -  Feeling bad or failure about yourself  - - 0 -  Trouble concentrating - - 0 -  Moving slowly or fidgety/restless - - 0 -  Suicidal thoughts - - 0 -  PHQ-9 Score - - 0 -  Difficult doing work/chores - - Not difficult at all -    Past Medical History:  Past Medical History:  Diagnosis Date   Hypertension    Hypothyroidism    Rosacea     Trigeminal neuralgia     Surgical History:  Past Surgical History:  Procedure Laterality Date   BUNIONECTOMY     HAMMER TOE SURGERY Left     Medications:  Current Outpatient Medications on File Prior to Visit  Medication Sig   augmented betamethasone dipropionate (DIPROLENE-AF) 0.05 % cream Apply topically 2 (two) times daily.   levothyroxine (SYNTHROID) 75 MCG tablet Take 1 tablet (75 mcg total) by mouth daily before breakfast.   OXcarbazepine (TRILEPTAL) 150 MG tablet Take 450-600 mg by mouth See admin instructions. Takes 3 tablets (450mg ) orally in the morning and 4 tablets (600mg ) orally at bedtime.   Vitamin D, Ergocalciferol, (DRISDOL) 1.25 MG (50000 UNIT) CAPS capsule Take 1 capsule (50,000 Units total) by mouth every 7 (seven) days. (Patient not taking: Reported on 04/18/2021)   No current facility-administered medications on file prior to visit.    Allergies:  No Known Allergies  Social History:  Social History   Socioeconomic History   Marital status: Divorced    Spouse name: Not on file   Number of children: Not on file   Years of education: Not on file   Highest education level: Not on file  Occupational History   Not on file  Tobacco Use   Smoking status: Never   Smokeless tobacco: Never  Vaping Use   Vaping Use: Never used  Substance and Sexual Activity   Alcohol use: Yes    Comment: occas   Drug use: No   Sexual activity: Yes    Birth control/protection: None  Other Topics Concern   Not on  file  Social History Narrative   Not on file   Social Determinants of Health   Financial Resource Strain: Not on file  Food Insecurity: Not on file  Transportation Needs: Not on file  Physical Activity: Not on file  Stress: Not on file  Social Connections: Not on file  Intimate Partner Violence: Not on file   Social History   Tobacco Use  Smoking Status Never  Smokeless Tobacco Never   Social History   Substance and Sexual Activity  Alcohol  Use Yes   Comment: occas    Family History:  Family History  Problem Relation Age of Onset   Cancer Mother        breast   Hypertension Mother    Breast cancer Mother 56   Cancer Father        lung   Heart disease Father    Diabetes Son 8       Type 1   Cancer Maternal Grandmother        breast   Breast cancer Maternal Grandmother 46   Cancer Maternal Grandfather        colon   Heart disease Paternal Grandfather    Hypertension Brother     Past medical history, surgical history, medications, allergies, family history and social history reviewed with patient today and changes made to appropriate areas of the chart.   Review of Systems  Constitutional: Negative.   HENT: Negative.    Eyes: Negative.   Respiratory: Negative.    Cardiovascular: Negative.   Gastrointestinal: Negative.   Genitourinary: Negative.   Musculoskeletal:  Positive for joint pain. Negative for back pain, falls, myalgias and neck pain.  Skin: Negative.   Neurological: Negative.   Endo/Heme/Allergies: Negative.   Psychiatric/Behavioral: Negative.    All other ROS negative except what is listed above and in the HPI.      Objective:    BP 128/76    Pulse 83    SpO2 99%   Wt Readings from Last 3 Encounters:  03/08/20 184 lb 12.8 oz (83.8 kg)  12/24/17 202 lb 2 oz (91.7 kg)  09/18/17 199 lb 6.4 oz (90.4 kg)    Physical Exam Vitals and nursing note reviewed. Exam conducted with a chaperone present.  Constitutional:      General: She is not in acute distress.    Appearance: Normal appearance. She is not ill-appearing, toxic-appearing or diaphoretic.  HENT:     Head: Normocephalic and atraumatic.     Right Ear: Tympanic membrane, ear canal and external ear normal. There is no impacted cerumen.     Left Ear: Tympanic membrane, ear canal and external ear normal. There is no impacted cerumen.     Nose: Nose normal. No congestion or rhinorrhea.     Mouth/Throat:     Mouth: Mucous membranes are  moist.     Pharynx: Oropharynx is clear. No oropharyngeal exudate or posterior oropharyngeal erythema.  Eyes:     General: No scleral icterus.       Right eye: No discharge.        Left eye: No discharge.     Extraocular Movements: Extraocular movements intact.     Conjunctiva/sclera: Conjunctivae normal.     Pupils: Pupils are equal, round, and reactive to light.  Neck:     Vascular: No carotid bruit.  Cardiovascular:     Rate and Rhythm: Normal rate and regular rhythm.     Pulses: Normal pulses.     Heart sounds:  No murmur heard.   No friction rub. No gallop.  Pulmonary:     Effort: Pulmonary effort is normal. No respiratory distress.     Breath sounds: Normal breath sounds. No stridor. No wheezing, rhonchi or rales.  Chest:     Chest wall: No tenderness.  Breasts:    Right: Normal. No swelling, bleeding, inverted nipple, mass, nipple discharge, skin change or tenderness.     Left: Normal. No swelling, bleeding, inverted nipple, mass, nipple discharge, skin change or tenderness.  Abdominal:     General: Abdomen is flat. Bowel sounds are normal. There is no distension.     Palpations: Abdomen is soft. There is no mass.     Tenderness: There is no abdominal tenderness. There is no right CVA tenderness, left CVA tenderness, guarding or rebound.     Hernia: No hernia is present.  Genitourinary:    Vagina: Normal.     Cervix: Normal.     Uterus: Normal.      Adnexa: Right adnexa normal and left adnexa normal.  Musculoskeletal:        General: No swelling, tenderness, deformity or signs of injury.     Cervical back: Normal range of motion and neck supple. No rigidity. No muscular tenderness.     Right lower leg: No edema.     Left lower leg: No edema.  Lymphadenopathy:     Cervical: No cervical adenopathy.  Skin:    General: Skin is warm and dry.     Capillary Refill: Capillary refill takes less than 2 seconds.     Coloration: Skin is not jaundiced or pale.     Findings: No  bruising, erythema, lesion or rash.  Neurological:     General: No focal deficit present.     Mental Status: She is alert and oriented to person, place, and time. Mental status is at baseline.     Cranial Nerves: No cranial nerve deficit.     Sensory: No sensory deficit.     Motor: No weakness.     Coordination: Coordination normal.     Gait: Gait normal.     Deep Tendon Reflexes: Reflexes normal.  Psychiatric:        Mood and Affect: Mood normal.        Behavior: Behavior normal.        Thought Content: Thought content normal.        Judgment: Judgment normal.    Results for orders placed or performed in visit on 04/13/20  Cologuard  Result Value Ref Range   Cologuard Negative Negative      Assessment & Plan:   Problem List Items Addressed This Visit       Endocrine   Hypothyroidism    Rechecking labs today. Await results. Treat as needed. Call with any concerns.       Relevant Orders   TSH     Genitourinary   Benign hypertensive renal disease    Under good control on current regimen. Continue current regimen. Continue to monitor. Call with any concerns. Refills given. Labs drawn today.       Relevant Orders   CBC with Differential/Platelet   Comprehensive metabolic panel   Urinalysis, Routine w reflex microscopic   Microalbumin, Urine Waived     Other   Vitamin D deficiency    Rechecking labs today. Await results. Treat as needed. Call with any concerns.       Relevant Orders   VITAMIN D 25 Hydroxy (Vit-D Deficiency,  Fractures)   Hyperlipidemia    Under good control on current regimen. Continue current regimen. Continue to monitor. Call with any concerns. Refills given. Labs drawn today.       Relevant Medications   atorvastatin (LIPITOR) 20 MG tablet   hydrochlorothiazide (HYDRODIURIL) 25 MG tablet   Other Relevant Orders   CBC with Differential/Platelet   Comprehensive metabolic panel   Lipid Panel w/o Chol/HDL Ratio   Other Visit Diagnoses      Routine general medical examination at a health care facility    -  Primary   Vaccines up to date. Screening labs checked today. Pap done. Mammo and colonoscopy up to date. Continue diet and exercise. Call with any concerns.    Relevant Orders   HIV Antibody (routine testing w rflx)   Cytology - PAP   Primary osteoarthritis of both knees       Referral to ortho made today   Relevant Orders   Ambulatory referral to Orthopedic Surgery        Follow up plan: Return in about 6 months (around 10/17/2021).   LABORATORY TESTING:  - Pap smear: pap done  IMMUNIZATIONS:   - Tdap: Tetanus vaccination status reviewed: last tetanus booster within 10 years. - Influenza: Up to date - Pneumovax: Not applicable - Prevnar: Not applicable - COVID: Up to date - HPV: Not applicable - Shingrix vaccine:  Will think about  SCREENING: -Mammogram: Up to date  - Colonoscopy: Up to date  - Bone Density: Not applicable   PATIENT COUNSELING:   Advised to take 1 mg of folate supplement per day if capable of pregnancy.   Sexuality: Discussed sexually transmitted diseases, partner selection, use of condoms, avoidance of unintended pregnancy  and contraceptive alternatives.   Advised to avoid cigarette smoking.  I discussed with the patient that most people either abstain from alcohol or drink within safe limits (<=14/week and <=4 drinks/occasion for males, <=7/weeks and <= 3 drinks/occasion for females) and that the risk for alcohol disorders and other health effects rises proportionally with the number of drinks per week and how often a drinker exceeds daily limits.  Discussed cessation/primary prevention of drug use and availability of treatment for abuse.   Diet: Encouraged to adjust caloric intake to maintain  or achieve ideal body weight, to reduce intake of dietary saturated fat and total fat, to limit sodium intake by avoiding high sodium foods and not adding table salt, and to maintain  adequate dietary potassium and calcium preferably from fresh fruits, vegetables, and low-fat dairy products.    stressed the importance of regular exercise  Injury prevention: Discussed safety belts, safety helmets, smoke detector, smoking near bedding or upholstery.   Dental health: Discussed importance of regular tooth brushing, flossing, and dental visits.    NEXT PREVENTATIVE PHYSICAL DUE IN 1 YEAR. Return in about 6 months (around 10/17/2021).

## 2021-04-21 LAB — CYTOLOGY - PAP
Comment: NEGATIVE
Diagnosis: NEGATIVE
High risk HPV: NEGATIVE

## 2021-06-16 DIAGNOSIS — M25562 Pain in left knee: Secondary | ICD-10-CM | POA: Diagnosis not present

## 2021-06-16 DIAGNOSIS — S83272A Complex tear of lateral meniscus, current injury, left knee, initial encounter: Secondary | ICD-10-CM | POA: Diagnosis not present

## 2021-06-16 DIAGNOSIS — X58XXXA Exposure to other specified factors, initial encounter: Secondary | ICD-10-CM | POA: Diagnosis not present

## 2021-07-04 ENCOUNTER — Other Ambulatory Visit: Payer: Self-pay | Admitting: Family Medicine

## 2021-07-05 NOTE — Telephone Encounter (Signed)
Requested medication (s) are due for refill today: Yes ? ?Requested medication (s) are on the active medication list: Yes ? ?Last refill:  06/12/20 ? ?Future visit scheduled: Yes ? ?Notes to clinic:  Prescription expired. ? ? ? ?Requested Prescriptions  ?Pending Prescriptions Disp Refills  ? levothyroxine (SYNTHROID) 75 MCG tablet [Pharmacy Med Name: L-THYROXINE (SYNTHROID) TABS 75MCG] 90 tablet 3  ?  Sig: TAKE 1 TABLET DAILY BEFORE BREAKFAST  ?  ? Endocrinology:  Hypothyroid Agents Failed - 07/04/2021  2:24 AM  ?  ?  Failed - TSH in normal range and within 360 days  ?  TSH  ?Date Value Ref Range Status  ?03/08/2020 0.666 0.450 - 4.500 uIU/mL Final  ?  ?  ?  ?  Passed - Valid encounter within last 12 months  ?  Recent Outpatient Visits   ? ?      ? 2 months ago Routine general medical examination at a health care facility  ? [facility] [place], [place], DO  ? 1 year ago Routine general medical examination at a health care facility  ? [facility] [place], [place] P, DO  ? 1 year ago Hoarseness  ? [facility] [place], [place], DO  ? 3 years ago Hypothyroidism, unspecified type  ? [facility] [place], [place], DO  ? 4 years ago Routine general medical examination at a health care facility  ? [facility] [place], [place], DO  ? ?  ?  ?Future Appointments   ? ?        ? In 3 months Laural Benes, Oralia Rud, DO [facility]  ? ?  ? ?  ?  ?  ?Signed Prescriptions Disp Refills  ? hydrochlorothiazide (HYDRODIURIL) 25 MG tablet 90 tablet 1  ?  Sig: TAKE 1 TABLET DAILY  ?  ? Cardiovascular: Diuretics - Thiazide Failed - 07/04/2021  2:24 AM  ?  ?  Failed - Cr in normal range and within 180 days  ?  Creatinine, Ser  ?Date Value Ref Range Status  ?03/08/2020 0.66 0.57 - 1.00 mg/dL Final  ?  ?  ?  ?  Failed - K in normal range and within 180 days  ?  Potassium  ?Date Value Ref Range Status  ?03/08/2020 4.2 3.5 - 5.2 mmol/L Final  ?  ?  ?  ?  Failed - Na in normal  range and within 180 days  ?  Sodium  ?Date Value Ref Range Status  ?03/08/2020 127 (L) 134 - 144 mmol/L Final  ?  ?  ?  ?  Passed - Last BP in normal range  ?  BP Readings from Last 1 Encounters:  ?04/18/21 128/76  ?  ?  ?  ?  Passed - Valid encounter within last 6 months  ?  Recent Outpatient Visits   ? ?      ? 2 months ago Routine general medical examination at a health care facility  ? [facility] [place], [place], DO  ? 1 year ago Routine general medical examination at a health care facility  ? [facility] [place], Megan P, DO  ? 1 year ago Hoarseness  ? [facility] [place], [place], DO  ? 3 years ago Hypothyroidism, unspecified type  ? [facility] [place], [place], DO  ? 4 years ago Routine general medical examination at a health care facility  ? [facility] [place], [place],  DO  ? ?  ?  ?Future Appointments   ? ?        ? In 3 months Johnson, Megan P, DO [facility]  ? ?  ? ?  ?  ?  ? atorvastatin (LIPITOR) 20 MG tablet 90 tablet 1  ?  Sig: TAKE 1 TABLET DAILY  ?  ? Cardiovascular:  Antilipid - Statins Failed - 07/04/2021  2:24 AM  ?  ?  Failed - Lipid Panel in normal range within the last 12 months  ?  Cholesterol, Total  ?Date Value Ref Range Status  ?03/08/2020 253 (H) 100 - 199 mg/dL Final  ? ?LDL Chol Calc (NIH)  ?Date Value Ref Range Status  ?03/08/2020 138 (H) 0 - 99 mg/dL Final  ? ?HDL  ?Date Value Ref Range Status  ?03/08/2020 90 >39 mg/dL Final  ? ?Triglycerides  ?Date Value Ref Range Status  ?03/08/2020 146 0 - 149 mg/dL Final  ? ?  ?  ?  Passed - Patient is not pregnant  ?  ?  Passed - Valid encounter within last 12 months  ?  Recent Outpatient Visits   ? ?      ? 2 months ago Routine general medical examination at a health care facility  ? [facility] [place], [place] P, DO  ? 1 year ago Routine general medical examination at a health care facility  ? [facility] [place], Megan P, DO  ? 1  year ago Hoarseness  ? [facility] [place], [place] P, DO  ? 3 years ago Hypothyroidism, unspecified type  ? [facility] [place], [place] P, DO  ? 4 years ago Routine general medical examination at a health care facility  ? [facility] [place], [place] P, DO  ? ?  ?  ?Future Appointments   ? ?        ? In 3 months Laural Benes, Oralia Rud, DO [facility]  ? ?  ? ?  ?  ?  ? ?

## 2021-07-06 NOTE — Telephone Encounter (Signed)
Lmom asking pt to call back to schedule lab only appointment ?

## 2021-07-06 NOTE — Telephone Encounter (Signed)
Needs to come in for blood work to be refilled. Order is in.  ?

## 2021-07-08 DIAGNOSIS — J069 Acute upper respiratory infection, unspecified: Secondary | ICD-10-CM | POA: Diagnosis not present

## 2021-07-08 NOTE — Telephone Encounter (Signed)
2nd attempt- Left message asking pt to call back to schedule lab only appt ?

## 2021-07-11 NOTE — Telephone Encounter (Signed)
PT states she is scheduled for lab visit 3/27. ?

## 2021-07-18 MED ORDER — LEVOTHYROXINE SODIUM 75 MCG PO TABS: 75.0000 ug | ORAL_TABLET | Freq: Every day | ORAL | 0 refills | Status: DC

## 2021-07-18 NOTE — Addendum Note (Signed)
Addended by: Valerie Roys on: 07/18/2021 09:46 AM ? ? Modules accepted: Orders ? ?

## 2021-07-25 DIAGNOSIS — Z0189 Encounter for other specified special examinations: Secondary | ICD-10-CM | POA: Diagnosis not present

## 2021-07-25 LAB — HEMOGLOBIN A1C: Hemoglobin A1C: 5.9

## 2021-08-03 DIAGNOSIS — Z0189 Encounter for other specified special examinations: Secondary | ICD-10-CM | POA: Diagnosis not present

## 2021-08-03 LAB — HEPATIC FUNCTION PANEL
ALT: 36 U/L — AB (ref 7–35)
AST: 25 (ref 13–35)
Alkaline Phosphatase: 83 (ref 25–125)
Bilirubin, Total: 0.6

## 2021-08-03 LAB — COMPREHENSIVE METABOLIC PANEL
Albumin: 4.8 (ref 3.5–5.0)
Globulin: 2.2

## 2021-08-03 LAB — VITAMIN B12: Vitamin B-12: 451

## 2021-08-03 LAB — BASIC METABOLIC PANEL
BUN: 14 (ref 4–21)
CO2: 21 (ref 13–22)
Chloride: 97 — AB (ref 99–108)
Creatinine: 0.9 (ref 0.5–1.1)
Glucose: 84
Potassium: 4.5 mEq/L (ref 3.5–5.1)
Sodium: 137 (ref 137–147)

## 2021-08-03 LAB — TSH: TSH: 1.28 (ref 0.41–5.90)

## 2021-08-03 LAB — LIPID PANEL
Cholesterol: 196 (ref 0–200)
HDL: 58 (ref 35–70)
LDL Cholesterol: 118
Triglycerides: 114 (ref 40–160)

## 2021-08-03 LAB — HIV ANTIBODY (ROUTINE TESTING W REFLEX): HIV 1&2 Ab, 4th Generation: NONREACTIVE

## 2021-08-03 LAB — VITAMIN D 25 HYDROXY (VIT D DEFICIENCY, FRACTURES): Vit D, 25-Hydroxy: 25.2

## 2021-08-15 ENCOUNTER — Encounter: Payer: Self-pay | Admitting: Family Medicine

## 2021-08-16 ENCOUNTER — Ambulatory Visit
Admission: RE | Admit: 2021-08-16 | Discharge: 2021-08-16 | Disposition: A | Discharge status: Home / Self Care | Payer: BC Managed Care – PPO | Source: Ambulatory Visit | Attending: Internal Medicine | Admitting: Internal Medicine

## 2021-08-16 ENCOUNTER — Ambulatory Visit
Admission: RE | Admit: 2021-08-16 | Discharge: 2021-08-16 | Disposition: A | Discharge status: Home / Self Care | Payer: BC Managed Care – PPO | Attending: Internal Medicine | Admitting: Internal Medicine

## 2021-08-16 ENCOUNTER — Encounter: Payer: Self-pay | Admitting: Internal Medicine

## 2021-08-16 ENCOUNTER — Ambulatory Visit: Payer: BC Managed Care – PPO | Admitting: Internal Medicine

## 2021-08-16 VITALS — BP 124/83 | HR 92 | Temp 98.3°F | Wt 218.4 lb

## 2021-08-16 DIAGNOSIS — R1031 Right lower quadrant pain: Secondary | ICD-10-CM | POA: Insufficient documentation

## 2021-08-16 DIAGNOSIS — R9389 Abnormal findings on diagnostic imaging of other specified body structures: Secondary | ICD-10-CM

## 2021-08-16 LAB — URINALYSIS, ROUTINE W REFLEX MICROSCOPIC
Bilirubin, UA: NEGATIVE
Glucose, UA: NEGATIVE
Ketones, UA: NEGATIVE
Leukocytes,UA: NEGATIVE
Nitrite, UA: NEGATIVE
Protein,UA: NEGATIVE
Specific Gravity, UA: 1.02 (ref 1.005–1.030)
Urobilinogen, Ur: 0.2 mg/dL (ref 0.2–1.0)
pH, UA: 7 (ref 5.0–7.5)

## 2021-08-16 LAB — MICROSCOPIC EXAMINATION
Bacteria, UA: NONE SEEN
WBC, UA: NONE SEEN /hpf (ref 0–5)

## 2021-08-16 LAB — CBC WITH DIFFERENTIAL/PLATELET
Hematocrit: 41.7 % (ref 34.0–46.6)
Hemoglobin: 15.2 g/dL (ref 11.1–15.9)
Lymphocytes Absolute: 2.1 10*3/uL (ref 0.7–3.1)
Lymphs: 28 %
MCH: 32.1 pg (ref 26.6–33.0)
MCHC: 36.5 g/dL — ABNORMAL HIGH (ref 31.5–35.7)
MCV: 88 fL (ref 79–97)
MID (Absolute): 0.6 10*3/uL (ref 0.1–1.6)
MID: 8 %
Neutrophils Absolute: 4.8 10*3/uL (ref 1.4–7.0)
Neutrophils: 64 %
Platelets: 315 10*3/uL (ref 150–450)
RBC: 4.74 x10E6/uL (ref 3.77–5.28)
RDW: 13 % (ref 11.7–15.4)
WBC: 7.5 10*3/uL (ref 3.4–10.8)

## 2021-08-16 MED ORDER — ONDANSETRON HCL 8 MG PO TABS: 8.0000 mg | ORAL_TABLET | Freq: Three times a day (TID) | ORAL | 0 refills | Status: DC | PRN

## 2021-08-16 MED ORDER — ACETAMINOPHEN-CODEINE #3 300-30 MG PO TABS: 1.0000 | ORAL_TABLET | Freq: Three times a day (TID) | ORAL | 0 refills | Status: AC | PRN

## 2021-08-16 NOTE — Progress Notes (Signed)
? ?BP 124/83   Pulse 92   Temp 98.3 ?F (36.8 ?C) (Oral)   Wt 218 lb 6.4 oz (99.1 kg)   SpO2 99%   BMI 39.63 kg/m?   ? ?Subjective:  ? ? Patient ID: Angela Boone, female    DOB: 1965-10-19, 56 y.o.   MRN: [id number] ? ?Chief Complaint  ?Patient presents with  ?? Abdominal Pain  ?  LRQ pain since friday  ? ? ?HPI: ?Angela Boone is a 56 y.o. female ? ?Abdominal Pain ?This is a new (started friday was walking to fet coffe had an intsne pan in the back lower radiaitng) problem. The current episode started in the past 7 days (on fridya was  a6 /10 in her back now non there). The onset quality is sudden. The problem occurs intermittently. The problem has been waxing and waning. Pain location: no more pain in the back , no blood in. The pain is at a severity of 5/10. The quality of the pain is sharp and aching. Associated symptoms include nausea. Pertinent negatives include no anorexia, arthralgias, belching, constipation, diarrhea, dysuria, fever, flatus, frequency, headaches, hematochezia, hematuria, melena, myalgias, vomiting or weight loss.  ? ?Chief Complaint  ?Patient presents with  ?? Abdominal Pain  ?  LRQ pain since friday  ? ? ?Relevant past medical, surgical, family and social history reviewed and updated as indicated. Interim medical history since our last visit reviewed. ?Allergies and medications reviewed and updated. ? ?Review of Systems  ?Constitutional:  Negative for fever and weight loss.  ?Gastrointestinal:  Positive for abdominal pain and nausea. Negative for anorexia, constipation, diarrhea, flatus, hematochezia, melena and vomiting.  ?Genitourinary:  Negative for dysuria, frequency and hematuria.  ?Musculoskeletal:  Negative for arthralgias and myalgias.  ?Neurological:  Negative for headaches.  ? ?Per HPI unless specifically indicated above ? ?   ?Objective:  ?  ?BP 124/83   Pulse 92   Temp 98.3 ?F (36.8 ?C) (Oral)   Wt 218 lb 6.4 oz (99.1 kg)   SpO2 99%   BMI 39.63 kg/m?   ?Wt  Readings from Last 3 Encounters:  ?08/16/21 218 lb 6.4 oz (99.1 kg)  ?03/08/20 184 lb 12.8 oz (83.8 kg)  ?12/24/17 202 lb 2 oz (91.7 kg)  ?  ?Physical Exam ?Vitals and nursing note reviewed.  ?Constitutional:   ?   General: She is not in acute distress. ?   Appearance: Normal appearance. She is not ill-appearing or diaphoretic.  ?Eyes:  ?   Conjunctiva/sclera: Conjunctivae normal.  ?Pulmonary:  ?   Breath sounds: No rhonchi.  ?Abdominal:  ?   General: Abdomen is flat. Bowel sounds are normal. There is no distension.  ?   Palpations: Abdomen is soft. There is no mass.  ?   Tenderness: There is abdominal tenderness in the right lower quadrant. There is no left CVA tenderness or guarding. Negative signs include Murphy's sign.  ?Skin: ?   General: Skin is warm and dry.  ?   Coloration: Skin is not jaundiced.  ?   Findings: No erythema.  ?Neurological:  ?   Mental Status: She is alert.  ? ? ?Results for orders placed or performed in visit on 08/15/21  ?HIV Antibody (routine testing w rflx)  ?Result Value Ref Range  ? HIV 1&2 Ab, 4th Generation non-reactive   ?VITAMIN D 25 Hydroxy (Vit-D Deficiency, Fractures)  ?Result Value Ref Range  ? Vit D, 25-Hydroxy 25.2   ?Basic metabolic panel  ?Result Value Ref  Range  ? Glucose 84   ? BUN 14 4 - 21  ? CO2 21 13 - 22  ? Creatinine 0.9 0.5 - 1.1  ? Potassium 4.5 3.5 - 5.1 mEq/L  ? Sodium 137 137 - 147  ? Chloride 97 (A) 99 - 108  ?Comprehensive metabolic panel  ?Result Value Ref Range  ? Globulin 2.2   ? Albumin 4.8 3.5 - 5.0  ?Lipid panel  ?Result Value Ref Range  ? Triglycerides 114 40 - 160  ? Cholesterol 196 0 - 200  ? HDL 58 35 - 70  ? LDL Cholesterol 118   ?Hepatic function panel  ?Result Value Ref Range  ? Alkaline Phosphatase 83 25 - 125  ? ALT 36 (A) 7 - 35 U/L  ? AST 25 13 - 35  ? Bilirubin, Total 0.6   ?Vitamin B12  ?Result Value Ref Range  ? Vitamin B-12 451   ?Hemoglobin A1c  ?Result Value Ref Range  ? Hemoglobin A1C 5.9   ?TSH  ?Result Value Ref Range  ? TSH 1.28 0.41  - 5.90  ? ?   ? ? ?Current Outpatient Medications:  ??  atorvastatin (LIPITOR) 20 MG tablet, TAKE 1 TABLET DAILY, Disp: 90 tablet, Rfl: 1 ??  augmented betamethasone dipropionate (DIPROLENE-AF) 0.05 % cream, Apply topically 2 (two) times daily., Disp: , Rfl:  ??  hydrochlorothiazide (HYDRODIURIL) 25 MG tablet, TAKE 1 TABLET DAILY, Disp: 90 tablet, Rfl: 1 ??  levothyroxine (SYNTHROID) 75 MCG tablet, Take 1 tablet (75 mcg total) by mouth daily before breakfast., Disp: 30 tablet, Rfl: 0 ??  ondansetron (ZOFRAN) 8 MG tablet, Take 1 tablet (8 mg total) by mouth every 8 (eight) hours as needed for nausea or vomiting., Disp: 20 tablet, Rfl: 0 ??  OXcarbazepine (TRILEPTAL) 150 MG tablet, Take 450-600 mg by mouth See admin instructions. Takes 3 tablets (450mg ) orally in the morning and 4 tablets (600mg ) orally at bedtime., Disp: , Rfl:   ? ? ?Assessment & Plan:  ?RLQ pain:  ?Trace blood in UA  ?NO WBC's  ?? Nephrolithiasis  ?Will check Xray might need CT scan vs . ? ? ?Problem List Items Addressed This Visit   ? ?  ? Other  ? Right lower quadrant abdominal pain - Primary  ? Relevant Orders  ? Urinalysis, Routine w reflex microscopic  ? CBC With Differential/Platelet  ? DG Abd 1 View  ?  ? ?Orders Placed This Encounter  ?Procedures  ?? DG Abd 1 View  ?? Urinalysis, Routine w reflex microscopic  ?? CBC With Differential/Platelet  ?  ? ?Meds ordered this encounter  ?Medications  ?? ondansetron (ZOFRAN) 8 MG tablet  ?  Sig: Take 1 tablet (8 mg total) by mouth every 8 (eight) hours as needed for nausea or vomiting.  ?  Dispense:  20 tablet  ?  Refill:  0  ?  ? ?Follow up plan: ?No follow-ups on file. ? ?

## 2021-08-18 NOTE — Progress Notes (Signed)
Pl let pt know needs an US of the RUQ . thnx ? ?Xray shows ?No evidence of bowel obstruction. ?2. Findings which may represent a lamellated gallstone. Correlation ?with right upper quadrant ultrasound is recommended.

## 2021-08-18 NOTE — Addendum Note (Signed)
Addended byLoura Pardon on: 08/18/2021 04:14 PM ? ? Modules accepted: Orders ? ?

## 2021-08-23 ENCOUNTER — Telehealth: Payer: Self-pay | Admitting: Family Medicine

## 2021-08-23 NOTE — Telephone Encounter (Signed)
Copied from [place] [id number]. Topic: General - Other ?>> Aug 23, 2021 11:40 AM McGill, Nelva Bush wrote: ?Reason for CRM: Pt is calling back to follow up on an ultrasound for her gallbladder. pt stated we advised her she would receive a call back from imaging to schedule, but she hasn't heard back. ? ?Please advise. I do not see orders for Ultrasound only x-ray. ?Pt requesting a call back. ?

## 2021-08-23 NOTE — Telephone Encounter (Signed)
Angela Boone, can you look into this please? 

## 2021-08-24 ENCOUNTER — Encounter: Payer: Self-pay | Admitting: Family Medicine

## 2021-08-24 ENCOUNTER — Other Ambulatory Visit: Payer: Self-pay | Admitting: Internal Medicine

## 2021-08-24 DIAGNOSIS — R1011 Right upper quadrant pain: Secondary | ICD-10-CM

## 2021-08-24 NOTE — Progress Notes (Signed)
Pt needs to fu with GI  ? RUQ ordered pending.  ?If continues having abdominal symtpoms will need to go to the ER for immediate help and symptomatic releif.  ?Xrays show FINDINGS: ?The bowel gas pattern is normal. A 1.9 cm diameter lucency, with ?thin surrounding radiopaque rim, is seen overlying the right upper ?quadrant. No radio-opaque renal calculi are seen. ?  ?IMPRESSION: ?1. No evidence of bowel obstruction. ?2. Findings which may represent a lamellated gallstone. Correlation ?with right upper quadrant ultrasound is recommended. ? ?Fu with pcp for further follow up and mx . ?Referred pt to GI.  ? ?Angela Boone ? ?

## 2021-08-24 NOTE — Telephone Encounter (Signed)
Its ordered - not sure why not scheudled. can you fu with jessie please ? Also if pts symtpoms are woresen she will need to go tot he ER for immediate help.  ?Will also refer her to GI not sure if jessie can hasten this up. Thnx. Needs to fu with PCP x 1-2 weeks. Thnx.

## 2021-08-30 ENCOUNTER — Ambulatory Visit: Payer: BC Managed Care – PPO | Admitting: Family Medicine

## 2021-08-31 ENCOUNTER — Other Ambulatory Visit: Payer: Self-pay | Admitting: Family Medicine

## 2021-08-31 ENCOUNTER — Ambulatory Visit
Admission: RE | Admit: 2021-08-31 | Discharge: 2021-08-31 | Disposition: A | Discharge status: Home / Self Care | Payer: BC Managed Care – PPO | Source: Ambulatory Visit | Attending: Internal Medicine | Admitting: Internal Medicine

## 2021-08-31 DIAGNOSIS — R1031 Right lower quadrant pain: Secondary | ICD-10-CM | POA: Diagnosis not present

## 2021-08-31 DIAGNOSIS — K8020 Calculus of gallbladder without cholecystitis without obstruction: Secondary | ICD-10-CM

## 2021-08-31 DIAGNOSIS — R160 Hepatomegaly, not elsewhere classified: Secondary | ICD-10-CM | POA: Diagnosis not present

## 2021-08-31 DIAGNOSIS — R7303 Prediabetes: Secondary | ICD-10-CM | POA: Diagnosis not present

## 2021-08-31 DIAGNOSIS — K760 Fatty (change of) liver, not elsewhere classified: Secondary | ICD-10-CM | POA: Diagnosis not present

## 2021-08-31 DIAGNOSIS — Z043 Encounter for examination and observation following other accident: Secondary | ICD-10-CM | POA: Diagnosis not present

## 2021-08-31 DIAGNOSIS — Z713 Dietary counseling and surveillance: Secondary | ICD-10-CM | POA: Diagnosis not present

## 2021-08-31 DIAGNOSIS — R9389 Abnormal findings on diagnostic imaging of other specified body structures: Secondary | ICD-10-CM | POA: Insufficient documentation

## 2021-09-07 ENCOUNTER — Encounter: Payer: Self-pay | Admitting: Family Medicine

## 2021-09-14 ENCOUNTER — Encounter: Payer: Self-pay | Admitting: Family Medicine

## 2021-09-14 ENCOUNTER — Encounter: Payer: Self-pay | Admitting: Surgery

## 2021-09-14 ENCOUNTER — Other Ambulatory Visit: Payer: Self-pay

## 2021-09-14 ENCOUNTER — Ambulatory Visit (INDEPENDENT_AMBULATORY_CARE_PROVIDER_SITE_OTHER): Payer: BC Managed Care – PPO | Admitting: Surgery

## 2021-09-14 VITALS — BP 136/83 | HR 76 | Temp 98.6°F | Ht 64.0 in | Wt 214.2 lb

## 2021-09-14 DIAGNOSIS — K8020 Calculus of gallbladder without cholecystitis without obstruction: Secondary | ICD-10-CM | POA: Diagnosis not present

## 2021-09-14 NOTE — Progress Notes (Signed)
?09/14/2021 ? ?Reason for Visit:  Symptomatic cholelithiasis ? ?Requesting Provider:  Park Liter, DO ? ?History of Present Illness: ?Angela Boone is a 56 y.o. female presenting for evaluation of symptomatic cholelithiasis.  The patient saw her PCP with abdominal pain radiating to the back on 08/16/21.  This had been ongoing for about 7 days prior to seeing the patient, intermittent in nature.  The patient denies any nausea or vomiting, and eventually the pain became soreness in the RUQ.  Currently she's asymptomatic.  She had an ultrasound on 08/31/21 which showed cholelithiasis, with a 1.8 cm gallstone but no evidence of cholecystitis.  This also showed hepatic steatosis.  Today, the patient reports that she's doing ok, without pain, and has made some changes in her diet.  Denies any fevers, chills, chest pain, shortness of breath.   ? ?Past Medical History: ?Past Medical History:  ?Diagnosis Date  ? Hypertension   ? Hypothyroidism   ? Rosacea   ? Trigeminal neuralgia   ?  ? ?Past Surgical History: ?Past Surgical History:  ?Procedure Laterality Date  ? BUNIONECTOMY    ? HAMMER TOE SURGERY Left   ? ? ?Home Medications: ?Prior to Admission medications   ?Medication Sig Start Date End Date Taking? Authorizing Provider  ?atorvastatin (LIPITOR) 20 MG tablet TAKE 1 TABLET DAILY 07/05/21  Yes Johnson, Megan P, DO  ?augmented betamethasone dipropionate (DIPROLENE-AF) 0.05 % cream Apply topically 2 (two) times daily. 12/19/19  Yes [provider]  ?hydrochlorothiazide (HYDRODIURIL) 25 MG tablet TAKE 1 TABLET DAILY 07/05/21  Yes Johnson, Megan P, DO  ?levothyroxine (SYNTHROID) 75 MCG tablet Take 1 tablet (75 mcg total) by mouth daily before breakfast. 07/18/21  Yes Johnson, Megan P, DO  ?OXcarbazepine (TRILEPTAL) 150 MG tablet Take 450-600 mg by mouth See admin instructions. Takes 3 tablets (490m) orally in the morning and 4 tablets (6069m orally at bedtime.   Yes [provider]  ? ? ?Allergies: ?No  Known Allergies ? ?Social History: ? reports that she has never smoked. She has never used smokeless tobacco. She reports current alcohol use. She reports that she does not use drugs.  ? ?Family History: ?Family History  ?Problem Relation Age of Onset  ? Cancer Mother   ?     breast  ? Hypertension Mother   ? Breast cancer Mother [id number]? Cancer Father   ?     lung  ? Heart disease Father   ? Diabetes Son 8  ?     Type 1  ? Cancer Maternal Grandmother   ?     breast  ? Breast cancer Maternal Grandmother [id number]? Cancer Maternal Grandfather   ?     colon  ? Heart disease Paternal Grandfather   ? Hypertension Brother   ? ? ?Review of Systems: ?Review of Systems  ?Constitutional:  Negative for chills and fever.  ?HENT:  Negative for hearing loss.   ?Respiratory:  Negative for shortness of breath.   ?Cardiovascular:  Negative for chest pain.  ?Gastrointestinal:  Positive for abdominal pain. Negative for constipation, diarrhea, nausea and vomiting.  ?Genitourinary:  Negative for dysuria.  ?Musculoskeletal:  Negative for myalgias.  ?Skin:  Negative for rash.  ?Neurological:  Negative for dizziness.  ?Psychiatric/Behavioral:  Negative for depression.   ? ?Physical Exam ?BP 136/83   Pulse 76   Temp 98.6 ?F (37 ?C) (Oral)   Ht _0  (1.626 m)   Wt 214 lb 3.2 oz (97.2 kg)  SpO2 99%   BMI 36.77 kg/m?  ?CONSTITUTIONAL: No acute distress, well nourished. ?HEENT:  Normocephalic, atraumatic, extraocular motion intact. ?NECK: Trachea is midline, and there is no jugular venous distension.  ?RESPIRATORY:  Lungs are clear, and breath sounds are equal bilaterally. Normal respiratory effort without pathologic use of accessory muscles. ?CARDIOVASCULAR: Heart is regular without murmurs, gallops, or rubs. ?GI: The abdomen is soft, non-distended, currently non-tender to palpation.  Negative Murphy's sign.  ?MUSCULOSKELETAL:  Normal muscle strength and tone in all four extremities.  No peripheral edema or cyanosis. ?SKIN: Skin turgor is  normal. There are no pathologic skin lesions.  ?NEUROLOGIC:  Motor and sensation is grossly normal.  Cranial nerves are grossly intact. ?PSYCH:  Alert and oriented to person, place and time. Affect is normal. ? ?Laboratory Analysis: ?Labs from 08/03/21: ?Na 137, K 4.5, Cl 97, CO2 21, BUN 14, Cr 0.9.  Total bili 0.6, AST 36, ALT 25, Alk Phos 83, albumin 4.8.  WBC 7.5, Hgt 15.2, Hct 41.7, Plt 315. ? ?Imaging: ?U/S RUQ 08/31/21: ?IMPRESSION: ?1. Cholelithiasis. ?2. Evidence of hepatic steatosis and mild hepatomegaly. ? ?Assessment and Plan: ?This is a 56 y.o. female with symptomatic cholelithiasis. ? ?--Discussed the ultrasound findings with the patient and how gallstones develop and the possible issues they can cause.  Currently she's asymptomatic.  Discussed with her that there's no perfect medical/non-surgical method to try to get rid of the stones or get rid of the symptoms.  Medications that can help dissolve stones don't always work well, and although she can try a low fat diet to trigger the gallbladder less, there is no guarantee that alone will help as the gallbladder is always doing some degree of work.  Typically the recommendation would be for surgery.   ?--The patient is interested in surgery, but reports that currently the timing of it is not appropriate due to other issues ongoing at home.  She would like to wait for now until things have improved and then contact us when she's ready.  I think that's reasonable given the findings on labs/US and that she's currently asymptomatic and it was her first episode.  Recommended for now trying a low fat diet. ?--Briefly discussed the surgical plan with her when she's ready, which would consist of robotic assisted cholecystectomy, and discussed the risks of bleeding, infection, injury to surrounding structures, that this would be outpatient surgery, post-op recovery.   ?--Will await for patient to call us when she's ready to schedule.  All questions answered. ? ?I  spent 60 minutes dedicated to the care of this patient on the date of this encounter to include pre-visit review of records, face-to-face time with the patient discussing diagnosis and management, and any post-visit coordination of care. ? ? ?Melvyn Neth, MD ?[facility] ? ?  ?

## 2021-09-14 NOTE — Patient Instructions (Addendum)
Please call if you decide to move forward with surgery.  ? ? ?Minimally Invasive Cholecystectomy ?Minimally invasive cholecystectomy is surgery to remove the gallbladder. The gallbladder is a pear-shaped organ that lies beneath the liver on the right side of the body. The gallbladder stores bile, which is a fluid that helps the body digest fats. Cholecystectomy is often done to treat inflammation (irritation and swelling) of the gallbladder (cholecystitis). This condition is usually caused by a buildup of gallstones (cholelithiasis) in the gallbladder or when the fluid in the gall bladder becomes stagnant because gallstones get stuck in the ducts (tubes) and block the flow of bile. This can result in inflammation and pain. In severe cases, emergency surgery may be required. ?This procedure is done through small incisions in the abdomen, instead of one large incision. It is also called laparoscopic surgery. A thin scope with a camera (laparoscope) is inserted through one incision. Then surgical instruments are inserted through the other incisions. In some cases, a minimally invasive surgery may need to be changed to a surgery that is done through a larger incision. This is called open surgery. ?Tell a health care provider about: ?Any allergies you have. ?All medicines you are taking, including vitamins, herbs, eye drops, creams, and over-the-counter medicines. ?Any problems you or family members have had with anesthetic medicines. ?Any bleeding problems you have. ?Any surgeries you have had. ?Any medical conditions you have. ?Whether you are pregnant or may be pregnant. ?What are the risks? ?Generally, this is a safe procedure. However, problems may occur, including: ?Infection. ?Bleeding. ?Allergic reactions to medicines. ?Damage to nearby structures or organs. ?A gallstone remaining in the common bile duct. The common bile duct carries bile from the gallbladder to the small intestine. ?A bile leak from the liver or  cystic duct after your gallbladder is removed. ?What happens before the procedure? ?When to stop eating and drinking ?Follow instructions from your health care provider about what you may eat and drink before your procedure. These may include: ?8 hours before the procedure ?Stop eating most foods. Do not eat meat, fried foods, or fatty foods. ?Eat only light foods, such as toast or crackers. ?All liquids are okay except energy drinks and alcohol. ?6 hours before the procedure ?Stop eating. ?Drink only clear liquids, such as water, clear fruit juice, black coffee, plain tea, and sports drinks. ?Do not drink energy drinks or alcohol. ?2 hours before the procedure ?Stop drinking all liquids. ?You may be allowed to take medicines with small sips of water. ?If you do not follow your health care provider's instructions, your procedure may be delayed or canceled. ?Medicines ?Ask your health care provider about: ?Changing or stopping your regular medicines. This is especially important if you are taking diabetes medicines or blood thinners. ?Taking medicines such as aspirin and ibuprofen. These medicines can thin your blood. Do not take these medicines unless your health care provider tells you to take them. ?Taking over-the-counter medicines, vitamins, herbs, and supplements. ?General instructions ?If you will be going home right after the procedure, plan to have a responsible adult: ?Take you home from the hospital or clinic. You will not be allowed to drive. ?Care for you for the time you are told. ?Do not use any products that contain nicotine or tobacco for at least 4 weeks before the procedure. These products include cigarettes, chewing tobacco, and vaping devices, such as e-cigarettes. If you need help quitting, ask your health care provider. ?Ask your health care provider: ?How your  surgery site will be marked. ?What steps will be taken to help prevent infection. These may include: ?Removing hair at the surgery  site. ?Washing skin with a germ-killing soap. ?Taking antibiotic medicine. ?What happens during the procedure? ? ?An IV will be inserted into one of your veins. ?You will be given one or both of the following: ?A medicine to help you relax (sedative). ?A medicine to make you fall asleep (general anesthetic). ?Your surgeon will make several small incisions in your abdomen. ?The laparoscope will be inserted through one of the small incisions. The camera on the laparoscope will send images to a monitor in the operating room. This lets your surgeon see inside your abdomen. ?A gas will be pumped into your abdomen. This will expand your abdomen to give the surgeon more room to perform the surgery. ?Other tools that are needed for the procedure will be inserted through the other incisions. The gallbladder will be removed through one of the incisions. ?Your common bile duct may be examined. If stones are found in the common bile duct, they may be removed. ?After your gallbladder has been removed, the incisions will be closed with stitches (sutures), staples, or skin glue. ?Your incisions will be covered with a bandage (dressing). ?The procedure may vary among health care providers and hospitals. ?What happens after the procedure? ?Your blood pressure, heart rate, breathing rate, and blood oxygen level will be monitored until you leave the hospital or clinic. ?You will be given medicines as needed to control your pain. ?You may have a drain placed in the incision. The drain will be removed a day or two after the procedure. ?Summary ?Minimally invasive cholecystectomy, also called laparoscopic cholecystectomy, is surgery to remove the gallbladder using small incisions. ?Tell your health care provider about all the medical conditions you have and all the medicines you are taking for those conditions. ?Before the procedure, follow instructions about when to stop eating and drinking and changing or stopping medicines. ?Plan to  have a responsible adult care for you for the time you are told after you leave the hospital or clinic. ?This information is not intended to replace advice given to you by your health care provider. Make sure you discuss any questions you have with your health care provider. ?Document Revised: 10/19/2020 Document Reviewed: 10/19/2020 ?Elsevier Patient Education ? 2023 [employer] ? ? ? ? ?Cholelithiasis ? ?Cholelithiasis is a disease in which gallstones form in the gallbladder. The gallbladder is an organ that stores bile. Bile is a fluid that helps to digest fats. Gallstones begin as small crystals and can slowly grow into stones. They may cause no symptoms until they block the gallbladder duct, or cystic duct, when the gallbladder tightens (contracts) after food is eaten. This can cause pain and is known as a gallbladder attack, or biliary colic. ?There are two main types of gallstones: ?Cholesterol stones. These are the most common type of gallstone. These stones are made of hardened cholesterol and are usually yellow-green in color. Cholesterol is a fat-like substance that is made in the liver. ?Pigment stones. These are dark in color and are made of a red-yellow substance, called bilirubin,that forms when hemoglobin from red blood cells breaks down. ?What are the causes? ?This condition may be caused by an imbalance in the different parts that make bile. This can happen if the bile: ?Has too much bilirubin. This can happen in certain blood diseases, such as sickle cell anemia. ?Has too much cholesterol. ?Does not have enough  bile salts. These salts help the body absorb and digest fats. ?In some cases, this condition can also be caused by the gallbladder not emptying completely or often enough. This is common during pregnancy. ?What increases the risk? ?The following factors may make you more likely to develop this condition: ?Being female. ?Having multiple pregnancies. Health care providers sometimes advise  removing diseased gallbladders before future pregnancies. ?Eating a diet that is heavy in fried foods, fat, and refined carbohydrates, such as white bread and white rice. ?Being obese. ?Being older than ag

## 2021-09-15 ENCOUNTER — Encounter: Payer: Self-pay | Admitting: Family Medicine

## 2021-09-21 ENCOUNTER — Encounter: Payer: Self-pay | Admitting: Family Medicine

## 2021-09-21 ENCOUNTER — Ambulatory Visit: Payer: BC Managed Care – PPO | Admitting: Family Medicine

## 2021-09-21 DIAGNOSIS — E039 Hypothyroidism, unspecified: Secondary | ICD-10-CM

## 2021-09-21 MED ORDER — SEMAGLUTIDE-WEIGHT MANAGEMENT 0.25 MG/0.5ML ~~LOC~~ SOAJ: 0.2500 mg | SUBCUTANEOUS | 0 refills | Status: AC

## 2021-09-21 MED ORDER — SEMAGLUTIDE-WEIGHT MANAGEMENT 2.4 MG/0.75ML ~~LOC~~ SOAJ: 2.4000 mg | SUBCUTANEOUS | 1 refills | Status: AC

## 2021-09-21 MED ORDER — SEMAGLUTIDE-WEIGHT MANAGEMENT 0.5 MG/0.5ML ~~LOC~~ SOAJ: 0.5000 mg | SUBCUTANEOUS | 0 refills | Status: AC

## 2021-09-21 MED ORDER — SEMAGLUTIDE-WEIGHT MANAGEMENT 1.7 MG/0.75ML ~~LOC~~ SOAJ: 1.7000 mg | SUBCUTANEOUS | 0 refills | Status: AC

## 2021-09-21 MED ORDER — SEMAGLUTIDE-WEIGHT MANAGEMENT 1 MG/0.5ML ~~LOC~~ SOAJ: 1.0000 mg | SUBCUTANEOUS | 0 refills | Status: AC

## 2021-09-21 NOTE — Progress Notes (Signed)
BP 107/70   Pulse 90   Temp 98.7 F (37.1 C)   Ht 5\' 3"  (1.6 m)   Wt 214 lb 3.2 oz (97.2 kg)   SpO2 96%   BMI 37.94 kg/m    Subjective:    Patient ID: , female    DOB: 1965-06-18, 56 y.o.   MRN: [id number]  HPI: Angela Boone is a 56 y.o. female  Chief Complaint  Patient presents with   Obesity    Patient would like to discuss medication options for weight loss   OBESITY Duration: chronic Previous attempts at weight loss: yes Complications of obesity: HTN, HLD, fatty liver Peak weight: 218lbs Weight loss goal: 20lbs Weight loss to date: 4lbs Requesting obesity pharmacotherapy: yes Current weight loss supplements/medications: no Previous weight loss supplements/meds: yes  HYPOTHYROIDISM Thyroid control status:controlled Satisfied with current treatment? yes Medication side effects: no Medication compliance: excellent compliance Recent dose adjustment:no Fatigue: no Cold intolerance: no Heat intolerance: no Weight gain: yes Weight loss: no Constipation: no Diarrhea/loose stools: no Palpitations: no Lower extremity edema: no Anxiety/depressed mood: no  Relevant past medical, surgical, family and social history reviewed and updated as indicated. Interim medical history since our last visit reviewed. Allergies and medications reviewed and updated.  Review of Systems  Constitutional: Negative.   Respiratory: Negative.    Cardiovascular: Negative.   Gastrointestinal: Negative.   Musculoskeletal: Negative.   Neurological: Negative.   Psychiatric/Behavioral: Negative.     Per HPI unless specifically indicated above     Objective:    BP 107/70   Pulse 90   Temp 98.7 F (37.1 C)   Ht 5\' 3"  (1.6 m)   Wt 214 lb 3.2 oz (97.2 kg)   SpO2 96%   BMI 37.94 kg/m   Wt Readings from Last 3 Encounters:  09/21/21 214 lb 3.2 oz (97.2 kg)  09/14/21 214 lb 3.2 oz (97.2 kg)  08/16/21 218 lb 6.4 oz (99.1 kg)    Physical Exam Vitals and  nursing note reviewed.  Constitutional:      General: She is not in acute distress.    Appearance: Normal appearance. She is obese. She is not ill-appearing, toxic-appearing or diaphoretic.  HENT:     Head: Normocephalic and atraumatic.     Right Ear: External ear normal.     Left Ear: External ear normal.     Nose: Nose normal.     Mouth/Throat:     Mouth: Mucous membranes are moist.     Pharynx: Oropharynx is clear.  Eyes:     General: No scleral icterus.       Right eye: No discharge.        Left eye: No discharge.     Extraocular Movements: Extraocular movements intact.     Conjunctiva/sclera: Conjunctivae normal.     Pupils: Pupils are equal, round, and reactive to light.  Cardiovascular:     Rate and Rhythm: Normal rate and regular rhythm.     Pulses: Normal pulses.     Heart sounds: Normal heart sounds. No murmur heard.   No friction rub. No gallop.  Pulmonary:     Effort: Pulmonary effort is normal. No respiratory distress.     Breath sounds: Normal breath sounds. No stridor. No wheezing, rhonchi or rales.  Chest:     Chest wall: No tenderness.  Musculoskeletal:        General: Normal range of motion.     Cervical back: Normal range of motion and  neck supple.  Skin:    General: Skin is warm and dry.     Capillary Refill: Capillary refill takes less than 2 seconds.     Coloration: Skin is not jaundiced or pale.     Findings: No bruising, erythema, lesion or rash.  Neurological:     General: No focal deficit present.     Mental Status: She is alert and oriented to person, place, and time. Mental status is at baseline.  Psychiatric:        Mood and Affect: Mood normal.        Behavior: Behavior normal.        Thought Content: Thought content normal.        Judgment: Judgment normal.    Results for orders placed or performed in visit on 08/16/21  Microscopic Examination   Urine  Result Value Ref Range   WBC, UA None seen 0 - 5 /hpf   RBC 0-2 0 - 2 /hpf    Epithelial Cells (non renal) 0-10 0 - 10 /hpf   Mucus, UA Present (A) Not Estab.   Bacteria, UA None seen None seen/Few  Urinalysis, Routine w reflex microscopic  Result Value Ref Range   Specific Gravity, UA 1.020 1.005 - 1.030   pH, UA 7.0 5.0 - 7.5   Color, UA Yellow Yellow   Appearance Ur Clear Clear   Leukocytes,UA Negative Negative   Protein,UA Negative Negative/Trace   Glucose, UA Negative Negative   Ketones, UA Negative Negative   RBC, UA Trace (A) Negative   Bilirubin, UA Negative Negative   Urobilinogen, Ur 0.2 0.2 - 1.0 mg/dL   Nitrite, UA Negative Negative   Microscopic Examination See below:   CBC With Differential/Platelet  Result Value Ref Range   WBC 7.5 3.4 - 10.8 x10E3/uL   RBC 4.74 3.77 - 5.28 x10E6/uL   Hemoglobin 15.2 11.1 - 15.9 g/dL   Hematocrit 63.1 49.7 - 46.6 %   MCV 88 79 - 97 fL   MCH 32.1 26.6 - 33.0 pg   MCHC 36.5 (H) 31.5 - 35.7 g/dL   RDW 02.6 37.8 - 58.8 %   Platelets 315 150 - 450 x10E3/uL   Neutrophils 64 Not Estab. %   Lymphs 28 Not Estab. %   MID 8 Not Estab. %   Neutrophils Absolute 4.8 1.4 - 7.0 x10E3/uL   Lymphocytes Absolute 2.1 0.7 - 3.1 x10E3/uL   MID (Absolute) 0.6 0.1 - 1.6 X10E3/uL      Assessment & Plan:   Problem List Items Addressed This Visit       Endocrine   Hypothyroidism    Rechecking labs today. Await results. Treat as needed.        Relevant Orders   TSH     Other   Morbid obesity (HCC) - Primary    Will start wegovy. Encouraged diet and exercise with goal of losing 1-2lbs per week. Call with any concerns.        Relevant Medications   Semaglutide-Weight Management 0.25 MG/0.5ML SOAJ   Semaglutide-Weight Management 0.5 MG/0.5ML SOAJ (Start on 10/20/2021)   Semaglutide-Weight Management 1 MG/0.5ML SOAJ (Start on 11/18/2021)   Semaglutide-Weight Management 1.7 MG/0.75ML SOAJ (Start on 12/17/2021)   Semaglutide-Weight Management 2.4 MG/0.75ML SOAJ (Start on 01/15/2022)     Follow up plan: Return in  about 3 months (around 12/22/2021) for OK to cancel appt in June.

## 2021-09-21 NOTE — Assessment & Plan Note (Signed)
Rechecking labs today. Await results. Treat as needed.  °

## 2021-09-21 NOTE — Assessment & Plan Note (Signed)
Will start wegovy. Encouraged diet and exercise with goal of losing 1-2lbs per week. Call with any concerns.

## 2021-09-22 ENCOUNTER — Other Ambulatory Visit: Payer: Self-pay | Admitting: Family Medicine

## 2021-09-22 LAB — TSH: TSH: 1.72 u[IU]/mL (ref 0.450–4.500)

## 2021-09-22 MED ORDER — LEVOTHYROXINE SODIUM 75 MCG PO TABS: 75.0000 ug | ORAL_TABLET | Freq: Every day | ORAL | 3 refills | Status: DC

## 2021-10-03 ENCOUNTER — Encounter: Payer: Self-pay | Admitting: Family Medicine

## 2021-10-14 ENCOUNTER — Encounter: Payer: Self-pay | Admitting: Family Medicine

## 2021-10-14 MED ORDER — LEVOTHYROXINE SODIUM 75 MCG PO TABS: 75.0000 ug | ORAL_TABLET | Freq: Every day | ORAL | 3 refills | Status: DC

## 2021-10-14 NOTE — Telephone Encounter (Signed)
Patient is requesting that [facility] [place] [facility] of [place]. Please and thank you, Refill is t'd up.

## 2021-10-18 ENCOUNTER — Ambulatory Visit: Payer: BC Managed Care – PPO | Admitting: Family Medicine

## 2021-11-07 ENCOUNTER — Telehealth: Payer: Self-pay | Admitting: Family Medicine

## 2021-11-07 NOTE — Telephone Encounter (Signed)
Cover My Med calling in re to the process of prior Auth, states may call when needed regarding  id [id number] on the following conversation re med [phone] ref ID [id number] Angela Boone, CMA to DENAJA VERHOEVEN       10/26/21  3:51 PM I was able to get in contact with your pharmacy and they said your insurance is requiring a prior authorization. The pharmacy is still out of stock on the low dose Wegovy so you will not be able to get that filled, they expect it to be back fully in stock in September.    Angela Boone, CMA

## 2021-11-08 NOTE — Telephone Encounter (Signed)
Per [employer], patient does not have pharmacy benefits through insurance. Patient has pharmacy benefits through [facility]. Attempted to do PA on CoverMyMeds with [facility] Bin,PCN, and Grp number, PA did not go through. Need to call [facility] for PA.  BIN: [id number] PCN: [id number] GRP: [id number] ID: [id number]

## 2021-11-08 NOTE — Telephone Encounter (Signed)
Returned call to covermymeds, respresentative states they do not process PA's for this insurance. Was instructed to call number on the back of insurance card. Will call for PA.

## 2021-11-18 ENCOUNTER — Telehealth: Payer: Self-pay | Admitting: Family Medicine

## 2021-11-18 NOTE — Telephone Encounter (Signed)
Copied from CRM [id number]. Topic: General - Other >> Nov 18, 2021  9:12 AM Esperanza Sheets wrote: Reason for CRM: Loraine Leriche w/ cover my meds requesting a cb regarding PA billing information   Please provide ref key [id number] when returning call

## 2021-12-24 ENCOUNTER — Other Ambulatory Visit: Payer: Self-pay | Admitting: Family Medicine

## 2021-12-25 ENCOUNTER — Other Ambulatory Visit: Payer: Self-pay | Admitting: Family Medicine

## 2021-12-26 NOTE — Telephone Encounter (Signed)
Requested Prescriptions  Pending Prescriptions Disp Refills  . atorvastatin (LIPITOR) 20 MG tablet [Pharmacy Med Name: ATORVASTATIN 20 MG TABLET] 90 tablet 1    Sig: TAKE 1 TABLET BY MOUTH EVERY DAY     Cardiovascular:  Antilipid - Statins Failed - 12/24/2021  1:22 AM      Failed - Lipid Panel in normal range within the last 12 months    Cholesterol, Total  Date Value Ref Range Status  03/08/2020 253 (H) 100 - 199 mg/dL Final   Cholesterol  Date Value Ref Range Status  08/03/2021 196 0 - 200 Final   LDL Chol Calc (NIH)  Date Value Ref Range Status  03/08/2020 138 (H) 0 - 99 mg/dL Final   LDL Cholesterol  Date Value Ref Range Status  08/03/2021 118  Final   HDL  Date Value Ref Range Status  08/03/2021 58 35 - 70 Final  03/08/2020 90 >39 mg/dL Final   Triglycerides  Date Value Ref Range Status  08/03/2021 114 40 - 160 Final         Passed - Patient is not pregnant      Passed - Valid encounter within last 12 months    Recent Outpatient Visits          3 months ago Morbid obesity (HCC)   [facility] [place], Megan P, DO   4 months ago Right lower quadrant abdominal pain   [facility] Vigg, Avanti, MD   8 months ago Routine general medical examination at a health care facility   [facility] [place], [place] P, DO   1 year ago Routine general medical examination at a health care facility   [facility] [place], [place], DO   2 years ago Hoarseness   [facility] [place], [place], DO      Future Appointments            In 3 weeks Laural Benes, Oralia Rud, DO [facility], [facility]

## 2021-12-26 NOTE — Telephone Encounter (Signed)
Requested Prescriptions  Pending Prescriptions Disp Refills  . hydrochlorothiazide (HYDRODIURIL) 25 MG tablet [Pharmacy Med Name: HYDROCHLOROTHIAZIDE 25 MG TAB] 90 tablet 1    Sig: TAKE 1 TABLET (25 MG TOTAL) BY MOUTH DAILY.     Cardiovascular: Diuretics - Thiazide Passed - 12/25/2021  1:23 AM      Passed - Cr in normal range and within 180 days    Creatinine  Date Value Ref Range Status  08/03/2021 0.9 0.5 - 1.1 Final   Creatinine, Ser  Date Value Ref Range Status  03/08/2020 0.66 0.57 - 1.00 mg/dL Final         Passed - K in normal range and within 180 days    Potassium  Date Value Ref Range Status  08/03/2021 4.5 3.5 - 5.1 mEq/L Final         Passed - Na in normal range and within 180 days    Sodium  Date Value Ref Range Status  08/03/2021 137 137 - 147 Final         Passed - Last BP in normal range    BP Readings from Last 1 Encounters:  09/21/21 107/70         Passed - Valid encounter within last 6 months    Recent Outpatient Visits          3 months ago Morbid obesity (HCC)   [facility] [place], Megan P, DO   4 months ago Right lower quadrant abdominal pain   [facility] Vigg, Avanti, MD   8 months ago Routine general medical examination at a health care facility   [facility] [place], [place] P, DO   1 year ago Routine general medical examination at a health care facility   [facility] [place], [place], DO   2 years ago Hoarseness   [facility] [place], [place], DO      Future Appointments            In 3 weeks Laural Benes, Oralia Rud, DO [facility], [facility]

## 2022-01-02 ENCOUNTER — Other Ambulatory Visit: Payer: Self-pay | Admitting: Family Medicine

## 2022-01-04 NOTE — Telephone Encounter (Signed)
Requested Prescriptions  Pending Prescriptions Disp Refills  . hydrochlorothiazide (HYDRODIURIL) 25 MG tablet [Pharmacy Med Name: HYDROCHLOROTHIAZIDE TABS 25MG ] 90 tablet 3    Sig: TAKE 1 TABLET DAILY     Cardiovascular: Diuretics - Thiazide Passed - 01/02/2022  1:39 AM      Passed - Cr in normal range and within 180 days    Creatinine  Date Value Ref Range Status  08/03/2021 0.9 0.5 - 1.1 Final   Creatinine, Ser  Date Value Ref Range Status  03/08/2020 0.66 0.57 - 1.00 mg/dL Final         Passed - K in normal range and within 180 days    Potassium  Date Value Ref Range Status  08/03/2021 4.5 3.5 - 5.1 mEq/L Final         Passed - Na in normal range and within 180 days    Sodium  Date Value Ref Range Status  08/03/2021 137 137 - 147 Final         Passed - Last BP in normal range    BP Readings from Last 1 Encounters:  09/21/21 107/70         Passed - Valid encounter within last 6 months    Recent Outpatient Visits          3 months ago Morbid obesity (HCC)   [facility] [place], Megan P, DO   4 months ago Right lower quadrant abdominal pain   [facility] Vigg, Avanti, MD   8 months ago Routine general medical examination at a health care facility   [facility] [place], [place] P, DO   1 year ago Routine general medical examination at a health care facility   [facility] [place], [place], DO   2 years ago Hoarseness   [facility] [place], [place], DO      Future Appointments            In 2 weeks [place], Laural Benes, DO [facility], [facility]           . atorvastatin (LIPITOR) 20 MG tablet [Pharmacy Med Name: ATORVASTATIN TABS 20MG ] 90 tablet 3    Sig: TAKE 1 TABLET DAILY     Cardiovascular:  Antilipid - Statins Failed - 01/02/2022  1:39 AM      Failed - Lipid Panel in normal range within the last 12 months    Cholesterol, Total  Date Value Ref Range Status  03/08/2020 253 (H) 100 - 199  mg/dL Final   Cholesterol  Date Value Ref Range Status  08/03/2021 196 0 - 200 Final   LDL Chol Calc (NIH)  Date Value Ref Range Status  03/08/2020 138 (H) 0 - 99 mg/dL Final   LDL Cholesterol  Date Value Ref Range Status  08/03/2021 118  Final   HDL  Date Value Ref Range Status  08/03/2021 58 35 - 70 Final  03/08/2020 90 >39 mg/dL Final   Triglycerides  Date Value Ref Range Status  08/03/2021 114 40 - 160 Final         Passed - Patient is not pregnant      Passed - Valid encounter within last 12 months    Recent Outpatient Visits          3 months ago Morbid obesity (HCC)   [facility] [place], Megan P, DO   4 months ago Right lower quadrant abdominal pain   [facility] Vigg, Avanti, MD  8 months ago Routine general medical examination at a health care facility   [facility] [place], [place] P, DO   1 year ago Routine general medical examination at a health care facility   [facility] [place], [place], DO   2 years ago Hoarseness   [facility] Dorcas [place], DO      Future Appointments            In 2 weeks Laural Benes, Oralia Rud, DO [facility], [facility]

## 2022-01-18 ENCOUNTER — Ambulatory Visit: Payer: BC Managed Care – PPO | Admitting: Family Medicine

## 2022-02-08 ENCOUNTER — Encounter: Payer: Self-pay | Admitting: Family Medicine

## 2022-02-09 ENCOUNTER — Ambulatory Visit: Payer: Self-pay

## 2022-02-09 MED ORDER — GABAPENTIN 100 MG PO CAPS: 100.0000 mg | ORAL_CAPSULE | Freq: Three times a day (TID) | ORAL | 0 refills | Status: DC

## 2022-02-09 NOTE — Telephone Encounter (Signed)
Gabapentin sent to her pharmacy- OK to keep appt for next week

## 2022-02-09 NOTE — Telephone Encounter (Signed)
Message from Roslynn Amble sent at 02/09/2022  8:42 AM EDT  Summary: Right sided jaw pain/trigeminal neuralgia?   The patient called in stating she is having right sided jaw pain. She said it started coming in on Monday and as of yesterday it has gotten pretty severe. She has used OXcarbazepine (TRILEPTAL) 150 MG tablet along with gabapentin before and it has helped her with her trigeminal neuralgia as that is what she was diagnosed with years ago.She states she gets bad flare ups from time to time. The earliest appt available at this time is Monday and she needs something before then. The patient states her level of pain is about a 6 at this time but that changes. Please assist patient further   [id number] [place] [place]  Phone: [phone]  Fax: [phone]          Chief Complaint: right sided jaw pain Symptoms: pain when touched is moderate and feels like a twinge. Feels with brushing her teeth Frequency: noted last week when washing her face Pertinent Negatives: Patient denies fever, toothache, nasal discharge, nasal congestion, clicking sensation in the jaw joint Disposition: [] ED /[] Urgent Care (no appt availability in office) / [x] Appointment(In office/virtual)/ []  [facility]/ [] Home Care/ [] Refused Recommended Disposition /[] [facility]/ []  Follow-up with PCP Additional Notes: pt wants to discuss restarting gabapentin. Pt has increased her Trileptal.  Reason for Disposition  [1] MODERATE pain (e.g., interferes with normal activities) AND [2] constant AND [3] present > 24 hours  Answer Assessment - Initial Assessment Questions 1. ONSET: "When did the pain start?" (e.g., minutes, hours, days)     Last week felt twinge of pain under right eye 2. ONSET: "Does the pain come and go, or has it been constant since it started?" (e.g., constant, intermittent, fleeting)     constant 3. SEVERITY: "How bad is the pain?"   (Scale 1-10; mild,  moderate or severe)   - MILD (1-3): doesn't interfere with normal activities    - MODERATE (4-7): interferes with normal activities or awakens from sleep    - SEVERE (8-10): excruciating pain, unable to do any normal activities      Touch =6  4. LOCATION: "Where does it hurt?"      Right sided jaw pain h/o trigeminal neuralgia 5. RASH: "Is there any redness, rash, or swelling of the face?"     no 6. FEVER: "Do you have a fever?" If Yes, ask: "What is it, how was it measured, and when did it start?"      no 7. OTHER SYMPTOMS: "Do you have any other symptoms?" (e.g., fever, toothache, nasal discharge, nasal congestion, clicking sensation in jaw joint)     no 8. PREGNANCY: "Is there any chance you are pregnant?" "When was your last menstrual period?"     N/a  Protocols used: Face Pain-A-AH

## 2022-02-10 ENCOUNTER — Ambulatory Visit: Payer: BC Managed Care – PPO | Admitting: Nurse Practitioner

## 2022-02-14 ENCOUNTER — Ambulatory Visit: Payer: BC Managed Care – PPO | Admitting: Family Medicine

## 2022-02-14 ENCOUNTER — Encounter: Payer: Self-pay | Admitting: Family Medicine

## 2022-02-14 VITALS — BP 138/88 | HR 76 | Temp 98.5°F | Wt 223.6 lb

## 2022-02-14 DIAGNOSIS — G500 Trigeminal neuralgia: Secondary | ICD-10-CM | POA: Diagnosis not present

## 2022-02-14 MED ORDER — GABAPENTIN 300 MG PO CAPS: 300.0000 mg | ORAL_CAPSULE | Freq: Three times a day (TID) | ORAL | 1 refills | Status: DC

## 2022-02-14 NOTE — Progress Notes (Signed)
BP 138/88   Pulse 76   Temp 98.5 F (36.9 C)   Wt 223 lb 9.6 oz (101.4 kg)   SpO2 98%   BMI 39.61 kg/m    Subjective:    Patient ID: Angela Boone, female    DOB: 1965/10/30, 56 y.o.   MRN: [id number]  HPI: MARSHE Angela Boone is a 56 y.o. female  Chief Complaint  Patient presents with   Trigeminal Neuralgia    Patient states she is having a flare up, noticed two weeks ago she was starting to get pain in the right side of her face. States she has been taking 300 mg of gabapentin BID    Has been taking oxycarbezpine 2 a day, now has been increasing back up. Now taking 4 in the AM and 3 in the PM. She had stopped gabapentin all together, but is now taking 300mg  BID. She notes that she hasn't gotten the electric pain but has been having the tenderness to touch and sensitivity. It's getting better since restarting the meds, but not quite there yet. She had a cold last week, but is feeling better from that. No other concerns or complaints at this time.  Relevant past medical, surgical, family and social history reviewed and updated as indicated. Interim medical history since our last visit reviewed. Allergies and medications reviewed and updated.  Review of Systems  Constitutional: Negative.   HENT: Negative.         R side of face pain  Respiratory: Negative.    Cardiovascular: Negative.   Gastrointestinal: Negative.   Musculoskeletal: Negative.   Neurological: Negative.   Psychiatric/Behavioral: Negative.      Per HPI unless specifically indicated above     Objective:    BP 138/88   Pulse 76   Temp 98.5 F (36.9 C)   Wt 223 lb 9.6 oz (101.4 kg)   SpO2 98%   BMI 39.61 kg/m   Wt Readings from Last 3 Encounters:  02/14/22 223 lb 9.6 oz (101.4 kg)  09/21/21 214 lb 3.2 oz (97.2 kg)  09/14/21 214 lb 3.2 oz (97.2 kg)    Physical Exam Vitals and nursing note reviewed.  Constitutional:      General: She is not in acute distress.    Appearance: Normal  appearance. She is not ill-appearing, toxic-appearing or diaphoretic.  HENT:     Head: Normocephalic and atraumatic.     Right Ear: External ear normal.     Left Ear: External ear normal.     Nose: Nose normal.     Mouth/Throat:     Mouth: Mucous membranes are moist.     Pharynx: Oropharynx is clear.  Eyes:     General: No scleral icterus.       Right eye: No discharge.        Left eye: No discharge.     Extraocular Movements: Extraocular movements intact.     Conjunctiva/sclera: Conjunctivae normal.     Pupils: Pupils are equal, round, and reactive to light.  Cardiovascular:     Rate and Rhythm: Normal rate and regular rhythm.     Pulses: Normal pulses.     Heart sounds: Normal heart sounds. No murmur heard.    No friction rub. No gallop.  Pulmonary:     Effort: Pulmonary effort is normal. No respiratory distress.     Breath sounds: Normal breath sounds. No stridor. No wheezing, rhonchi or rales.  Chest:     Chest wall: No tenderness.  Musculoskeletal:        General: Normal range of motion.     Cervical back: Normal range of motion and neck supple.  Skin:    General: Skin is warm and dry.     Capillary Refill: Capillary refill takes less than 2 seconds.     Coloration: Skin is not jaundiced or pale.     Findings: No bruising, erythema, lesion or rash.  Neurological:     General: No focal deficit present.     Mental Status: She is alert and oriented to person, place, and time. Mental status is at baseline.  Psychiatric:        Mood and Affect: Mood normal.        Behavior: Behavior normal.        Thought Content: Thought content normal.        Judgment: Judgment normal.     Results for orders placed or performed in visit on 09/21/21  TSH  Result Value Ref Range   TSH 1.720 0.450 - 4.500 uIU/mL      Assessment & Plan:   Problem List Items Addressed This Visit       Nervous and Auditory   Trigeminal neuralgia of right side of face - Primary    Will increase  back up on her gabapentin and play with dose to where it's working the best. Declines referral back to neurology today. Continue to monitor. Call with any concerns.       Relevant Medications   gabapentin (NEURONTIN) 300 MG capsule     Follow up plan: Return December Physical.

## 2022-02-14 NOTE — Assessment & Plan Note (Signed)
Will increase back up on her gabapentin and play with dose to where it's working the best. Declines referral back to neurology today. Continue to monitor. Call with any concerns.

## 2022-02-14 NOTE — Progress Notes (Signed)
Sent mychart message

## 2022-03-26 DIAGNOSIS — H10029 Other mucopurulent conjunctivitis, unspecified eye: Secondary | ICD-10-CM | POA: Diagnosis not present

## 2022-03-28 ENCOUNTER — Other Ambulatory Visit: Payer: Self-pay | Admitting: Family Medicine

## 2022-03-28 NOTE — Telephone Encounter (Signed)
Requested Prescriptions  Pending Prescriptions Disp Refills   hydrochlorothiazide (HYDRODIURIL) 25 MG tablet [Pharmacy Med Name: HYDROCHLOROTHIAZIDE 25 MG TAB] 90 tablet 1    Sig: TAKE 1 TABLET (25 MG TOTAL) BY MOUTH DAILY.     Cardiovascular: Diuretics - Thiazide Failed - 03/28/2022  2:00 AM      Failed - Cr in normal range and within 180 days    Creatinine  Date Value Ref Range Status  08/03/2021 0.9 0.5 - 1.1 Final   Creatinine, Ser  Date Value Ref Range Status  03/08/2020 0.66 0.57 - 1.00 mg/dL Final         Failed - K in normal range and within 180 days    Potassium  Date Value Ref Range Status  08/03/2021 4.5 3.5 - 5.1 mEq/L Final         Failed - Na in normal range and within 180 days    Sodium  Date Value Ref Range Status  08/03/2021 137 137 - 147 Final         Passed - Last BP in normal range    BP Readings from Last 1 Encounters:  02/14/22 138/88         Passed - Valid encounter within last 6 months    Recent Outpatient Visits           1 month ago Trigeminal neuralgia of right side of face   [facility] [place], Megan P, DO   6 months ago Morbid obesity (HCC)   [facility] [place], Megan P, DO   7 months ago Right lower quadrant abdominal pain   [facility] Vigg, Avanti, MD   11 months ago Routine general medical examination at a health care facility   [facility] [place], [place] P, DO   2 years ago Routine general medical examination at a health care facility   [facility] [place], Oralia Rud, DO       Future Appointments             In 3 months Laural Benes, Oralia Rud, DO [facility], [facility]

## 2022-04-03 DIAGNOSIS — J069 Acute upper respiratory infection, unspecified: Secondary | ICD-10-CM | POA: Diagnosis not present

## 2022-04-09 DIAGNOSIS — J0100 Acute maxillary sinusitis, unspecified: Secondary | ICD-10-CM | POA: Diagnosis not present

## 2022-04-26 ENCOUNTER — Encounter: Payer: Self-pay | Admitting: Family Medicine

## 2022-04-26 ENCOUNTER — Other Ambulatory Visit: Payer: Self-pay | Admitting: Family Medicine

## 2022-04-26 DIAGNOSIS — Z1231 Encounter for screening mammogram for malignant neoplasm of breast: Secondary | ICD-10-CM

## 2022-05-02 ENCOUNTER — Encounter: Payer: Self-pay | Admitting: Family Medicine

## 2022-05-04 ENCOUNTER — Ambulatory Visit
Admission: RE | Admit: 2022-05-04 | Discharge: 2022-05-04 | Disposition: A | Discharge status: Home / Self Care | Payer: BC Managed Care – PPO | Source: Ambulatory Visit | Attending: Family Medicine | Admitting: Family Medicine

## 2022-05-04 DIAGNOSIS — Z1231 Encounter for screening mammogram for malignant neoplasm of breast: Secondary | ICD-10-CM | POA: Insufficient documentation

## 2022-05-09 ENCOUNTER — Ambulatory Visit: Payer: BC Managed Care – PPO

## 2022-05-17 ENCOUNTER — Encounter: Payer: Self-pay | Admitting: Family Medicine

## 2022-05-19 ENCOUNTER — Other Ambulatory Visit: Payer: Self-pay | Admitting: Family Medicine

## 2022-05-29 ENCOUNTER — Encounter: Payer: Self-pay | Admitting: Family Medicine

## 2022-05-30 ENCOUNTER — Encounter: Payer: Self-pay | Admitting: Family Medicine

## 2022-06-06 MED ORDER — SEMAGLUTIDE-WEIGHT MANAGEMENT 2.4 MG/0.75ML ~~LOC~~ SOAJ: 2.4000 mg | SUBCUTANEOUS | 1 refills | Status: AC

## 2022-06-06 MED ORDER — SEMAGLUTIDE-WEIGHT MANAGEMENT 0.5 MG/0.5ML ~~LOC~~ SOAJ: 0.5000 mg | SUBCUTANEOUS | 0 refills | Status: AC

## 2022-06-06 MED ORDER — SEMAGLUTIDE-WEIGHT MANAGEMENT 1.7 MG/0.75ML ~~LOC~~ SOAJ: 1.7000 mg | SUBCUTANEOUS | 0 refills | Status: AC

## 2022-06-06 MED ORDER — SEMAGLUTIDE-WEIGHT MANAGEMENT 1 MG/0.5ML ~~LOC~~ SOAJ: 1.0000 mg | SUBCUTANEOUS | 0 refills | Status: AC

## 2022-06-06 MED ORDER — SEMAGLUTIDE-WEIGHT MANAGEMENT 0.25 MG/0.5ML ~~LOC~~ SOAJ: 0.2500 mg | SUBCUTANEOUS | 0 refills | Status: AC

## 2022-06-22 DIAGNOSIS — L249 Irritant contact dermatitis, unspecified cause: Secondary | ICD-10-CM | POA: Diagnosis not present

## 2022-06-22 DIAGNOSIS — L2089 Other atopic dermatitis: Secondary | ICD-10-CM | POA: Diagnosis not present

## 2022-06-28 ENCOUNTER — Telehealth: Payer: Self-pay

## 2022-06-28 NOTE — Telephone Encounter (Signed)
Spoke with [employer] Benefits today to follow up with patient's prior authorization for prescription of Wegovy. They are now requesting a Medical Letter of Necessity including patient's current BMI before they can reach a determination for the prior authorization. Please advise?

## 2022-06-30 ENCOUNTER — Encounter: Payer: Self-pay | Admitting: Family Medicine

## 2022-06-30 ENCOUNTER — Encounter: Payer: BC Managed Care – PPO | Admitting: Family Medicine

## 2022-06-30 NOTE — Telephone Encounter (Signed)
Letter on chart, OK to send

## 2022-06-30 NOTE — Telephone Encounter (Signed)
Requested Medical Letter of Necessity was completed and printed to be faxed to patient's insurance company at [phone].

## 2022-08-14 ENCOUNTER — Other Ambulatory Visit: Payer: Self-pay | Admitting: Family Medicine

## 2022-08-15 NOTE — Telephone Encounter (Signed)
Requested Prescriptions  Pending Prescriptions Disp Refills   hydrochlorothiazide (HYDRODIURIL) 25 MG tablet [Pharmacy Med Name: HYDROCHLOROTHIAZIDE 25 MG TAB] 90 tablet 1    Sig: TAKE 1 TABLET (25 MG TOTAL) BY MOUTH DAILY.     Cardiovascular: Diuretics - Thiazide Failed - 08/14/2022  4:55 PM      Failed - Cr in normal range and within 180 days    Creatinine  Date Value Ref Range Status  08/03/2021 0.9 0.5 - 1.1 Final   Creatinine, Ser  Date Value Ref Range Status  03/08/2020 0.66 0.57 - 1.00 mg/dL Final         Failed - K in normal range and within 180 days    Potassium  Date Value Ref Range Status  08/03/2021 4.5 3.5 - 5.1 mEq/L Final         Failed - Na in normal range and within 180 days    Sodium  Date Value Ref Range Status  08/03/2021 137 137 - 147 Final         Failed - Valid encounter within last 6 months    Recent Outpatient Visits           6 months ago Trigeminal neuralgia of right side of face   [place] [facility] [place], Megan P, DO   10 months ago Morbid obesity [facility])   [place] [facility], [facility] P, DO   12 months ago Right lower quadrant abdominal pain   [place] [facility], Avanti, MD   1 year ago Routine general medical examination at a health care facility   [place] [place], [place] P, DO   2 years ago Routine general medical examination at a health care facility   [place], [facility] P, DO              Passed - Last BP in normal range    BP Readings from Last 1 Encounters:  02/14/22 138/88          gabapentin (NEURONTIN) 100 MG capsule [Pharmacy Med Name: GABAPENTIN 100 MG CAPSULE] 90 capsule 2    Sig: TAKE 1-3 CAPSULES (100-300 MG TOTAL) BY MOUTH 3 (THREE) TIMES DAILY.     Neurology: Anticonvulsants - gabapentin Failed - 08/14/2022  4:55 PM      Failed - Cr in normal range and within 360 days    Creatinine  Date  Value Ref Range Status  08/03/2021 0.9 0.5 - 1.1 Final   Creatinine, Ser  Date Value Ref Range Status  03/08/2020 0.66 0.57 - 1.00 mg/dL Final         Passed - Completed PHQ-2 or PHQ-9 in the last 360 days      Passed - Valid encounter within last 12 months    Recent Outpatient Visits           6 months ago Trigeminal neuralgia of right side of face   [place] [facility] [place], Megan P, DO   10 months ago Morbid obesity [facility])   [place] [facility] [place], Megan P, DO   12 months ago Right lower quadrant abdominal pain    [facility] Vigg, Avanti, MD   1 year ago Routine general medical examination at a health care facility   [place] [facility] [place], [place] P, DO   2 years ago Routine general medical examination at a health care facility  [place] [place], [place], [place]

## 2022-08-15 NOTE — Telephone Encounter (Signed)
Pt needs an appt scheduled.

## 2022-08-15 NOTE — Telephone Encounter (Signed)
Requested Prescriptions  Pending Prescriptions Disp Refills   hydrochlorothiazide (HYDRODIURIL) 25 MG tablet [Pharmacy Med Name: HYDROCHLOROTHIAZIDE 25 MG TAB] 90 tablet 1    Sig: TAKE 1 TABLET (25 MG TOTAL) BY MOUTH DAILY.     Cardiovascular: Diuretics - Thiazide Failed - 08/14/2022  4:55 PM      Failed - Cr in normal range and within 180 days    Creatinine  Date Value Ref Range Status  08/03/2021 0.9 0.5 - 1.1 Final   Creatinine, Ser  Date Value Ref Range Status  03/08/2020 0.66 0.57 - 1.00 mg/dL Final         Failed - K in normal range and within 180 days    Potassium  Date Value Ref Range Status  08/03/2021 4.5 3.5 - 5.1 mEq/L Final         Failed - Na in normal range and within 180 days    Sodium  Date Value Ref Range Status  08/03/2021 137 137 - 147 Final         Failed - Valid encounter within last 6 months    Recent Outpatient Visits           6 months ago Trigeminal neuralgia of right side of face   [place] [facility] [place], Megan P, DO   10 months ago Morbid obesity [facility])   [place] [facility] [place], Megan P, DO   12 months ago Right lower quadrant abdominal pain   [place] [facility], Avanti, MD   1 year ago Routine general medical examination at a health care facility   [facility] [place], [place] P, DO   2 years ago Routine general medical examination at a health care facility   [facility], [place] P, DO              Passed - Last BP in normal range    BP Readings from Last 1 Encounters:  02/14/22 138/88         Signed Prescriptions Disp Refills   gabapentin (NEURONTIN) 100 MG capsule 90 capsule 2    Sig: TAKE 1-3 CAPSULES (100-300 MG TOTAL) BY MOUTH 3 (THREE) TIMES DAILY.     Neurology: Anticonvulsants - gabapentin Failed - 08/14/2022  4:55 PM      Failed - Cr in normal range and within 360 days    Creatinine  Date Value Ref  Range Status  08/03/2021 0.9 0.5 - 1.1 Final   Creatinine, Ser  Date Value Ref Range Status  03/08/2020 0.66 0.57 - 1.00 mg/dL Final         Passed - Completed PHQ-2 or PHQ-9 in the last 360 days      Passed - Valid encounter within last 12 months    Recent Outpatient Visits           6 months ago Trigeminal neuralgia of right side of face   [facility] [place], Megan P, DO   10 months ago Morbid obesity [facility])   [place] [facility] [place], Megan P, DO   12 months ago Right lower quadrant abdominal pain   [place] [facility], Avanti, MD   1 year ago Routine general medical examination at a health care facility   [place] [facility] [place], [place] P, DO   2 years ago Routine general medical examination at a health care facility   [facility] [place]  [facility] [place], [place], [place]

## 2022-10-02 DIAGNOSIS — H47323 Drusen of optic disc, bilateral: Secondary | ICD-10-CM | POA: Diagnosis not present

## 2022-10-11 ENCOUNTER — Other Ambulatory Visit: Payer: Self-pay | Admitting: Family Medicine

## 2022-10-12 NOTE — Telephone Encounter (Signed)
Patient needs OV for additional refills, will refill for 30 days until OV can be made.  Requested Prescriptions  Pending Prescriptions Disp Refills   hydrochlorothiazide (HYDRODIURIL) 25 MG tablet [Pharmacy Med Name: HYDROCHLOROTHIAZIDE 25 MG TAB] 30 tablet 0    Sig: TAKE 1 TABLET (25 MG TOTAL) BY MOUTH DAILY.     Cardiovascular: Diuretics - Thiazide Failed - 10/11/2022  8:11 AM      Failed - Cr in normal range and within 180 days    Creatinine  Date Value Ref Range Status  08/03/2021 0.9 0.5 - 1.1 Final   Creatinine, Ser  Date Value Ref Range Status  03/08/2020 0.66 0.57 - 1.00 mg/dL Final         Failed - K in normal range and within 180 days    Potassium  Date Value Ref Range Status  08/03/2021 4.5 3.5 - 5.1 mEq/L Final         Failed - Na in normal range and within 180 days    Sodium  Date Value Ref Range Status  08/03/2021 137 137 - 147 Final         Failed - Valid encounter within last 6 months    Recent Outpatient Visits           8 months ago Trigeminal neuralgia of right side of face   [place] [facility] [place], Megan P, DO   1 year ago Morbid obesity [facility])   [place] [facility] [place], Megan P, DO   1 year ago Right lower quadrant abdominal pain   [place] [facility], Avanti, MD   1 year ago Routine general medical examination at a health care facility   [facility] [place], [place] P, DO   2 years ago Routine general medical examination at a health care facility   [facility], Megan P, DO              Passed - Last BP in normal range    BP Readings from Last 1 Encounters:  02/14/22 138/88

## 2022-10-13 DIAGNOSIS — B351 Tinea unguium: Secondary | ICD-10-CM | POA: Diagnosis not present

## 2022-10-13 DIAGNOSIS — L609 Nail disorder, unspecified: Secondary | ICD-10-CM | POA: Diagnosis not present

## 2022-11-05 ENCOUNTER — Other Ambulatory Visit: Payer: Self-pay | Admitting: Family Medicine

## 2022-11-06 NOTE — Telephone Encounter (Signed)
Requested medications are due for refill today.  yes  Requested medications are on the active medications list.  yes  Last refill. 10/12/2022 #30 0 rf  Future visit scheduled.   no  Notes to clinic.  Courtesy refill already given. Labs are expired. Pt needs an ov.    Requested Prescriptions  Pending Prescriptions Disp Refills   hydrochlorothiazide (HYDRODIURIL) 25 MG tablet [Pharmacy Med Name: HYDROCHLOROTHIAZIDE 25 MG TAB] 90 tablet 1    Sig: TAKE 1 TABLET (25 MG TOTAL) BY MOUTH DAILY.     Cardiovascular: Diuretics - Thiazide Failed - 11/05/2022  9:30 AM      Failed - Cr in normal range and within 180 days    Creatinine  Date Value Ref Range Status  08/03/2021 0.9 0.5 - 1.1 Final   Creatinine, Ser  Date Value Ref Range Status  03/08/2020 0.66 0.57 - 1.00 mg/dL Final         Failed - K in normal range and within 180 days    Potassium  Date Value Ref Range Status  08/03/2021 4.5 3.5 - 5.1 mEq/L Final         Failed - Na in normal range and within 180 days    Sodium  Date Value Ref Range Status  08/03/2021 137 137 - 147 Final         Failed - Valid encounter within last 6 months    Recent Outpatient Visits           8 months ago Trigeminal neuralgia of right side of face   [place] [facility] [place], Megan P, DO   1 year ago Morbid obesity [facility])   [place] [facility] [place], Megan P, DO   1 year ago Right lower quadrant abdominal pain   [place] [facility], Avanti, MD   1 year ago Routine general medical examination at a health care facility   [place] [place], [place] P, DO   2 years ago Routine general medical examination at a health care facility   [facility], Megan P, DO              Passed - Last BP in normal range    BP Readings from Last 1 Encounters:  02/14/22 138/88

## 2023-01-05 ENCOUNTER — Encounter: Payer: Self-pay | Admitting: Family Medicine

## 2023-01-05 ENCOUNTER — Other Ambulatory Visit: Payer: Self-pay | Admitting: Family Medicine

## 2023-01-05 ENCOUNTER — Ambulatory Visit: Payer: Self-pay

## 2023-01-05 NOTE — Telephone Encounter (Signed)
Requested Prescriptions  Pending Prescriptions Disp Refills   atorvastatin (LIPITOR) 20 MG tablet [Pharmacy Med Name: ATORVASTATIN 20 MG TABLET] 90 tablet 0    Sig: TAKE 1 TABLET BY MOUTH EVERY DAY     Cardiovascular:  Antilipid - Statins Failed - 01/05/2023  1:49 AM      Failed - Lipid Panel in normal range within the last 12 months    Cholesterol, Total  Date Value Ref Range Status  03/08/2020 253 (H) 100 - 199 mg/dL Final   Cholesterol  Date Value Ref Range Status  08/03/2021 196 0 - 200 Final   LDL Chol Calc (NIH)  Date Value Ref Range Status  03/08/2020 138 (H) 0 - 99 mg/dL Final   LDL Cholesterol  Date Value Ref Range Status  08/03/2021 118  Final   HDL  Date Value Ref Range Status  08/03/2021 58 35 - 70 Final  03/08/2020 90 >39 mg/dL Final   Triglycerides  Date Value Ref Range Status  08/03/2021 114 40 - 160 Final         Passed - Patient is not pregnant      Passed - Valid encounter within last 12 months    Recent Outpatient Visits           10 months ago Trigeminal neuralgia of right side of face   [place] [facility] [place], Megan P, DO   1 year ago Morbid obesity [facility])   [place] [facility] [place], Megan P, DO   1 year ago Right lower quadrant abdominal pain   [place] [facility], Avanti, MD   1 year ago Routine general medical examination at a health care facility   [facility] [place], [place] P, DO   2 years ago Routine general medical examination at a health care facility   [facility] Dorcas Carrow, DO       Future Appointments             In 1 week Dorcas Carrow, DO [place] [facility]

## 2023-01-05 NOTE — Telephone Encounter (Signed)
Chief Complaint: Dizzy Symptoms: Dizziness and dull headache  Frequency: comes and goes  Pertinent Negatives: Patient denies chest pain, nausea, falls, vomiting Disposition: [] ED /[] Urgent Care (no appt availability in office) / [x] Appointment(In office/virtual)/ []  [place] [facility]/ [] Home Care/ [] Refused Recommended Disposition /[] [place] [facility]/ []  Follow-up with PCP Additional Notes: Patient states she has been feeling dizzy with a dull headache for about 3-4 days now. Patient states her blood pressure was elevated last night reading 151/100 but this morning it was back to normal range around 121/79 patient stated she was not sure of the exact numbers but that's what she thinks it was. Patient states she thinks her symptoms are due to stress from work, over 60 people were recently laid off and her work load has increased. Patient states she has been able to do all of her normal activities but she does feel tired as well. Advised patient that MyChart message was reviewed and it was advised that patient schedule and appointment to be evaluated. Care advice was given and patient has been scheduled to see PCP 01/16/23 per her request. Also advised patient to continue to monitor her blood pressure and callback if bottom number is above 110. Patient verbalized understanding.   Summary: dizziness   Patient called stated she has been experiencing dizziness and her blood pressure was 151/100 last night. Please f/u with patient     Reason for Disposition  [1] MILD dizziness (e.g., walking normally) AND [2] has NOT been evaluated by doctor (or NP/PA) for this  (Exception: Dizziness caused by heat exposure, sudden standing, or poor fluid intake.)  Answer Assessment - Initial Assessment Questions 1. DESCRIPTION: "Describe your dizziness."     Weak and unsteady  2. LIGHTHEADED: "Do you feel lightheaded?" (e.g., somewhat faint, woozy, weak upon standing)     Weak upon standing  3. VERTIGO: "Do  you feel like either you or the room is spinning or tilting?" (i.e. vertigo)     No 4. SEVERITY: "How bad is it?"  "Do you feel like you are going to faint?" "Can you stand and walk?"   - MILD: Feels slightly dizzy, but walking normally.   - MODERATE: Feels unsteady when walking, but not falling; interferes with normal activities (e.g., school, work).   - SEVERE: Unable to walk without falling, or requires assistance to walk without falling; feels like passing out now.      Mild to moderate  5. ONSET:  "When did the dizziness begin?"    3- 4 days ago  6. AGGRAVATING FACTORS: "Does anything make it worse?" (e.g., standing, change in head position)     Nothing that I can think of.        8. CAUSE: "What do you think is causing the dizziness?"     Stress from work maybe 9. RECURRENT SYMPTOM: "Have you had dizziness before?" If Yes, ask: "When was the last time?" "What happened that time?"     No  10. OTHER SYMPTOMS: "Do you have any other symptoms?" (e.g., fever, chest pain, vomiting, diarrhea, bleeding)       Fatigue  Protocols used: Dizziness - Lightheadedness-A-AH

## 2023-01-05 NOTE — Telephone Encounter (Signed)
Attempted to reach patient, LVM to call office back to schedule an appointment.  Put in CRM.

## 2023-01-11 ENCOUNTER — Ambulatory Visit: Payer: Self-pay

## 2023-01-11 ENCOUNTER — Other Ambulatory Visit: Payer: Self-pay | Admitting: Family Medicine

## 2023-01-11 ENCOUNTER — Ambulatory Visit: Payer: BC Managed Care – PPO | Admitting: Family Medicine

## 2023-01-11 VITALS — BP 127/82 | HR 80 | Temp 98.0°F | Ht 63.39 in | Wt 203.8 lb

## 2023-01-11 DIAGNOSIS — Z0000 Encounter for general adult medical examination without abnormal findings: Secondary | ICD-10-CM | POA: Diagnosis not present

## 2023-01-11 DIAGNOSIS — E782 Mixed hyperlipidemia: Secondary | ICD-10-CM | POA: Diagnosis not present

## 2023-01-11 DIAGNOSIS — E039 Hypothyroidism, unspecified: Secondary | ICD-10-CM | POA: Diagnosis not present

## 2023-01-11 DIAGNOSIS — E559 Vitamin D deficiency, unspecified: Secondary | ICD-10-CM

## 2023-01-11 DIAGNOSIS — I129 Hypertensive chronic kidney disease with stage 1 through stage 4 chronic kidney disease, or unspecified chronic kidney disease: Secondary | ICD-10-CM

## 2023-01-11 LAB — MICROALBUMIN, URINE WAIVED
Creatinine, Urine Waived: 50 mg/dL (ref 10–300)
Microalb, Ur Waived: 10 mg/L (ref 0–19)

## 2023-01-11 MED ORDER — ATORVASTATIN CALCIUM 20 MG PO TABS: 20.0000 mg | ORAL_TABLET | Freq: Every day | ORAL | 1 refills | Status: DC

## 2023-01-11 MED ORDER — LISINOPRIL 10 MG PO TABS: 10.0000 mg | ORAL_TABLET | Freq: Every day | ORAL | 3 refills | Status: DC

## 2023-01-11 NOTE — Assessment & Plan Note (Signed)
Rechecking labs today. Await results. Treat as needed.  °

## 2023-01-11 NOTE — Telephone Encounter (Signed)
Summary: still experiencing dizziness   Pt stated she is still experiencing dizziness, and her blood pressure was 156/100 this morning.  Pt is scheduled to see PCP 09/17 and declined to see anyone else.  Seeking clinical advice.     Chief Complaint: BP continues to be elevated with dizziness. Has appointment 01/16/23. Will only see Dr. Laural Benes. Asking to be worked in. Symptoms: Above Frequency: Last week Pertinent Negatives: Patient denies chest pain Disposition: [] ED /[] Urgent Care (no appt availability in office) / [] Appointment(In office/virtual)/ []  [place] [facility]/ [] Home Care/ [] Refused Recommended Disposition /[] [place] [facility]/ [x]  Follow-up with PCP Additional Notes: Please advise pt.  Reason for Disposition  [1] Systolic BP  >= 130 OR Diastolic >= 80 AND [2] taking BP medications  Answer Assessment - Initial Assessment Questions 1. BLOOD PRESSURE: "What is the blood pressure?" "Did you take at least two measurements 5 minutes apart?"     156/100 2. ONSET: "When did you take your blood pressure?"     Last week 3. HOW: "How did you take your blood pressure?" (e.g., automatic home BP monitor, visiting nurse)     Home cuff 4. HISTORY: "Do you have a history of high blood pressure?"     Yes 5. MEDICINES: "Are you taking any medicines for blood pressure?" "Have you missed any doses recently?"     Yes 6. OTHER SYMPTOMS: "Do you have any symptoms?" (e.g., blurred vision, chest pain, difficulty breathing, headache, weakness)     Dizziness 7. PREGNANCY: "Is there any chance you are pregnant?" "When was your last menstrual period?"     No  Protocols used: Blood Pressure - High-A-AH

## 2023-01-11 NOTE — Progress Notes (Signed)
BP 127/82   Pulse 80   Temp 98 F (36.7 C) (Oral)   Ht 5' 3.39" (1.61 m)   Wt 203 lb 12.8 oz (92.4 kg)   SpO2 98%   BMI 35.66 kg/m    Subjective:    Patient ID: Angela Boone, female    DOB: 1966-03-13, 57 y.o.   MRN: [id number]  HPI: Angela Boone is a 57 y.o. female presenting on 01/11/2023 for comprehensive medical examination. Current medical complaints include:  HYPERTENSION / HYPERLIPIDEMIA Satisfied with current treatment? no Duration of hypertension: chronic BP monitoring frequency:  occasionally- when feeling poorly BP range: 145/100, 160/101, 152/94, 132/81 BP medication side effects: no Past BP meds: HCTZ Duration of hyperlipidemia: chronic Cholesterol medication side effects: no Cholesterol supplements: none Past cholesterol medications: atorvastatin Medication compliance: excellent compliance Aspirin: no Recent stressors: no Recurrent headaches: no Visual changes: no Palpitations: no Dyspnea: no Chest pain: no Lower extremity edema: no Dizzy/lightheaded: yes  HYPOTHYROIDISM Thyroid control status:unsure Satisfied with current treatment? no Medication side effects: no Medication compliance: excellent compliance Recent dose adjustment:no Fatigue: yes Cold intolerance: no Heat intolerance: no Weight gain: no Weight loss: no Constipation: no Diarrhea/loose stools: no Palpitations: no Lower extremity edema: no Anxiety/depressed mood: no  Menopausal Symptoms: no  Depression Screen done today and results listed below:     01/11/2023    1:08 PM 02/14/2022    8:39 AM 09/21/2021    8:26 AM 04/18/2021    1:32 PM 03/08/2020    1:11 PM  Depression screen PHQ 2/9  Decreased Interest 0 0 0 0 0  Down, Depressed, Hopeless 0 0 0 0 0  PHQ - 2 Score 0 0 0 0 0  Altered sleeping 1 0 0    Tired, decreased energy 1 0 0    Change in appetite 0 0 0    Feeling bad or failure about yourself  0 0 0    Trouble concentrating 0 0 0    Moving slowly or  fidgety/restless 0 0 0    Suicidal thoughts 0 0 0    PHQ-9 Score 2 0 0    Difficult doing work/chores Not difficult at all Not difficult at all       Past Medical History:  Past Medical History:  Diagnosis Date   Hypertension    Hypothyroidism    Rosacea    Trigeminal neuralgia     Surgical History:  Past Surgical History:  Procedure Laterality Date   BUNIONECTOMY     HAMMER TOE SURGERY Left     Medications:  Current Outpatient Medications on File Prior to Visit  Medication Sig   augmented betamethasone dipropionate (DIPROLENE-AF) 0.05 % cream Apply topically 2 (two) times daily.   hydrochlorothiazide (HYDRODIURIL) 25 MG tablet TAKE 1 TABLET (25 MG TOTAL) BY MOUTH DAILY.   levothyroxine (SYNTHROID) 75 MCG tablet Take 1 tablet (75 mcg total) by mouth daily before breakfast.   OXcarbazepine (TRILEPTAL) 150 MG tablet Take 450-600 mg by mouth See admin instructions. Takes 3 tablets (450mg ) orally in the morning and 4 tablets (600mg ) orally at bedtime.   No current facility-administered medications on file prior to visit.    Allergies:  No Known Allergies  Social History:  Social History   Socioeconomic History   Marital status: Married    Spouse name: Not on file   Number of children: Not on file   Years of education: Not on file   Highest education level: Associate degree: academic program  Occupational History   Not on file  Tobacco Use   Smoking status: Never   Smokeless tobacco: Never  Vaping Use   Vaping status: Never Used  Substance and Sexual Activity   Alcohol use: Yes    Comment: occas   Drug use: No   Sexual activity: Yes    Birth control/protection: None  Other Topics Concern   Not on file  Social History Narrative   Not on file   Social Determinants of Health   Financial Resource Strain: Low Risk  (01/11/2023)   Overall Financial Resource Strain (CARDIA)    Difficulty of Paying Living Expenses: Not hard at all  Food Insecurity: No Food  Insecurity (01/11/2023)   Hunger Vital Sign    Worried About Running Out of Food in the Last Year: Never true    Ran Out of Food in the Last Year: Never true  Transportation Needs: No Transportation Needs (01/11/2023)   PRAPARE - Administrator, Civil Service (Medical): No    Lack of Transportation (Non-Medical): No  Physical Activity: Insufficiently Active (01/11/2023)   Exercise Vital Sign    Days of Exercise per Week: 2 days    Minutes of Exercise per Session: 30 min  Stress: Stress Concern Present (01/11/2023)   [employer] of Occupational Health - Occupational Stress Questionnaire    Feeling of Stress : To some extent  Social Connections: Moderately Isolated (01/11/2023)   Social Connection and Isolation Panel [NHANES]    Frequency of Communication with Friends and Family: More than three times a week    Frequency of Social Gatherings with Friends and Family: Twice a week    Attends Religious Services: Never    Diplomatic Services operational officer: No    Attends Engineer, structural: Not on file    Marital Status: Married  Catering manager Violence: Not on file   Social History   Tobacco Use  Smoking Status Never  Smokeless Tobacco Never   Social History   Substance and Sexual Activity  Alcohol Use Yes   Comment: occas    Family History:  Family History  Problem Relation Age of Onset   Cancer Mother        breast   Hypertension Mother    Breast cancer Mother 13   Cancer Father        lung   Heart disease Father    Diabetes Son 8       Type 1   Cancer Maternal Grandmother        breast   Breast cancer Maternal Grandmother 46   Cancer Maternal Grandfather        colon   Heart disease Paternal Grandfather    Hypertension Brother     Past medical history, surgical history, medications, allergies, family history and social history reviewed with patient today and changes made to appropriate areas of the chart.   Review of Systems   Constitutional: Negative.   HENT: Negative.    Eyes:  Positive for blurred vision. Negative for double vision, photophobia, pain, discharge and redness.  Respiratory: Negative.    Cardiovascular:  Positive for leg swelling. Negative for chest pain, palpitations, orthopnea, claudication and PND.  Gastrointestinal: Negative.   Genitourinary: Negative.   Musculoskeletal: Negative.   Skin: Negative.   Neurological:  Positive for dizziness. Negative for tingling, tremors, sensory change, speech change, focal weakness, seizures, loss of consciousness, weakness and headaches.  Endo/Heme/Allergies: Negative.   Psychiatric/Behavioral: Negative.  All other ROS negative except what is listed above and in the HPI.      Objective:    BP 127/82   Pulse 80   Temp 98 F (36.7 C) (Oral)   Ht 5' 3.39" (1.61 m)   Wt 203 lb 12.8 oz (92.4 kg)   SpO2 98%   BMI 35.66 kg/m   Wt Readings from Last 3 Encounters:  01/11/23 203 lb 12.8 oz (92.4 kg)  02/14/22 223 lb 9.6 oz (101.4 kg)  09/21/21 214 lb 3.2 oz (97.2 kg)    Physical Exam Vitals and nursing note reviewed.  Constitutional:      General: She is not in acute distress.    Appearance: Normal appearance. She is obese. She is not ill-appearing, toxic-appearing or diaphoretic.  HENT:     Head: Normocephalic and atraumatic.     Right Ear: Tympanic membrane, ear canal and external ear normal. There is no impacted cerumen.     Left Ear: Tympanic membrane, ear canal and external ear normal. There is no impacted cerumen.     Nose: Nose normal. No congestion or rhinorrhea.     Mouth/Throat:     Mouth: Mucous membranes are moist.     Pharynx: Oropharynx is clear. No oropharyngeal exudate or posterior oropharyngeal erythema.  Eyes:     General: No scleral icterus.       Right eye: No discharge.        Left eye: No discharge.     Extraocular Movements: Extraocular movements intact.     Conjunctiva/sclera: Conjunctivae normal.     Pupils: Pupils  are equal, round, and reactive to light.  Neck:     Vascular: No carotid bruit.  Cardiovascular:     Rate and Rhythm: Normal rate and regular rhythm.     Pulses: Normal pulses.     Heart sounds: No murmur heard.    No friction rub. No gallop.  Pulmonary:     Effort: Pulmonary effort is normal. No respiratory distress.     Breath sounds: Normal breath sounds. No stridor. No wheezing, rhonchi or rales.  Chest:     Chest wall: No tenderness.  Abdominal:     General: Abdomen is flat. Bowel sounds are normal. There is no distension.     Palpations: Abdomen is soft. There is no mass.     Tenderness: There is no abdominal tenderness. There is no right CVA tenderness, left CVA tenderness, guarding or rebound.     Hernia: No hernia is present.  Genitourinary:    Comments: Breast and pelvic exams deferred with shared decision making Musculoskeletal:        General: No swelling, tenderness, deformity or signs of injury.     Cervical back: Normal range of motion and neck supple. No rigidity. No muscular tenderness.     Right lower leg: No edema.     Left lower leg: No edema.  Lymphadenopathy:     Cervical: No cervical adenopathy.  Skin:    General: Skin is warm and dry.     Capillary Refill: Capillary refill takes less than 2 seconds.     Coloration: Skin is not jaundiced or pale.     Findings: No bruising, erythema, lesion or rash.  Neurological:     General: No focal deficit present.     Mental Status: She is alert and oriented to person, place, and time. Mental status is at baseline.     Cranial Nerves: No cranial nerve deficit.  Sensory: No sensory deficit.     Motor: No weakness.     Coordination: Coordination normal.     Gait: Gait normal.     Deep Tendon Reflexes: Reflexes normal.  Psychiatric:        Mood and Affect: Mood normal.        Behavior: Behavior normal.        Thought Content: Thought content normal.        Judgment: Judgment normal.     Results for orders  placed or performed in visit on 01/11/23  Microalbumin, Urine Waived  Result Value Ref Range   Microalb, Ur Waived 10 0 - 19 mg/L   Creatinine, Urine Waived 50 10 - 300 mg/dL   Microalb/Creat Ratio 30-300 (H) <30 mg/g      Assessment & Plan:   Problem List Items Addressed This Visit       Endocrine   Hypothyroidism    Rechecking labs today. Await results. Treat as needed.         Genitourinary   Benign hypertensive renal disease    Will add in 10mg  lisinopril and recheck in about a month. Will not refill hydrochlorothiazide at this time  in hopes of being able to use a combo pill next time. Labs drawn today. Call with any concerns.       Relevant Orders   Microalbumin, Urine Waived (Completed)     Other   Vitamin D deficiency    Labs drawn today. Await results.       Relevant Orders   VITAMIN D 25 Hydroxy (Vit-D Deficiency, Fractures)   Hyperlipidemia    Under good control on current regimen. Continue current regimen. Continue to monitor. Call with any concerns. Refills given. Labs drawn today.       Relevant Medications   lisinopril (ZESTRIL) 10 MG tablet   atorvastatin (LIPITOR) 20 MG tablet   Other Visit Diagnoses     Routine general medical examination at a health care facility    -  Primary   Vaccines up to date/declined. Screening labs checked today. Pap, mammo, colonoscopy up to date. Continue diet and exercise. Call with any concerns.   Relevant Orders   CBC with Differential/Platelet   Comprehensive metabolic panel   Lipid Panel w/o Chol/HDL Ratio   TSH   Microalbumin, Urine Waived (Completed)        Follow up plan: Return in about 4 weeks (around 02/08/2023).   LABORATORY TESTING:  - Pap smear: up to date  IMMUNIZATIONS:   - Tdap: Tetanus vaccination status reviewed: last tetanus booster within 10 years. - Influenza: Given elsewhere - Pneumovax: Not applicable - Prevnar: Not applicable - COVID: Refused - HPV: Not applicable - Shingrix  vaccine: Refused  SCREENING: -Mammogram: Up to date  - Colonoscopy: Up to date  - Bone Density: Not applicable   PATIENT COUNSELING:   Advised to take 1 mg of folate supplement per day if capable of pregnancy.   Sexuality: Discussed sexually transmitted diseases, partner selection, use of condoms, avoidance of unintended pregnancy  and contraceptive alternatives.   Advised to avoid cigarette smoking.  I discussed with the patient that most people either abstain from alcohol or drink within safe limits (<=14/week and <=4 drinks/occasion for males, <=7/weeks and <= 3 drinks/occasion for females) and that the risk for alcohol disorders and other health effects rises proportionally with the number of drinks per week and how often a drinker exceeds daily limits.  Discussed cessation/primary prevention of drug use  and availability of treatment for abuse.   Diet: Encouraged to adjust caloric intake to maintain  or achieve ideal body weight, to reduce intake of dietary saturated fat and total fat, to limit sodium intake by avoiding high sodium foods and not adding table salt, and to maintain adequate dietary potassium and calcium preferably from fresh fruits, vegetables, and low-fat dairy products.    stressed the importance of regular exercise  Injury prevention: Discussed safety belts, safety helmets, smoke detector, smoking near bedding or upholstery.   Dental health: Discussed importance of regular tooth brushing, flossing, and dental visits.    NEXT PREVENTATIVE PHYSICAL DUE IN 1 YEAR. Return in about 4 weeks (around 02/08/2023).

## 2023-01-11 NOTE — Telephone Encounter (Signed)
I can see her at 1 if she can get here. OK to double book

## 2023-01-11 NOTE — Assessment & Plan Note (Signed)
Will add in 10mg  lisinopril and recheck in about a month. Will not refill hydrochlorothiazide at this time  in hopes of being able to use a combo pill next time. Labs drawn today. Call with any concerns.

## 2023-01-11 NOTE — Assessment & Plan Note (Signed)
Under good control on current regimen. Continue current regimen. Continue to monitor. Call with any concerns. Refills given. Labs drawn today.   

## 2023-01-11 NOTE — Telephone Encounter (Signed)
Appointment has been scheduled.

## 2023-01-11 NOTE — Assessment & Plan Note (Signed)
Labs drawn today. Await results.  

## 2023-01-12 LAB — CBC WITH DIFFERENTIAL/PLATELET
Basophils Absolute: 0.1 10*3/uL (ref 0.0–0.2)
Basos: 1 %
EOS (ABSOLUTE): 0.1 10*3/uL (ref 0.0–0.4)
Eos: 2 %
Hematocrit: 44.3 % (ref 34.0–46.6)
Hemoglobin: 14.9 g/dL (ref 11.1–15.9)
Immature Grans (Abs): 0 10*3/uL (ref 0.0–0.1)
Immature Granulocytes: 0 %
Lymphocytes Absolute: 1.6 10*3/uL (ref 0.7–3.1)
Lymphs: 27 %
MCH: 31.9 pg (ref 26.6–33.0)
MCHC: 33.6 g/dL (ref 31.5–35.7)
MCV: 95 fL (ref 79–97)
Monocytes Absolute: 0.7 10*3/uL (ref 0.1–0.9)
Monocytes: 11 %
Neutrophils Absolute: 3.6 10*3/uL (ref 1.4–7.0)
Neutrophils: 59 %
Platelets: 334 10*3/uL (ref 150–450)
RBC: 4.67 x10E6/uL (ref 3.77–5.28)
RDW: 12.4 % (ref 11.7–15.4)
WBC: 6.1 10*3/uL (ref 3.4–10.8)

## 2023-01-12 LAB — COMPREHENSIVE METABOLIC PANEL
ALT: 24 IU/L (ref 0–32)
AST: 21 IU/L (ref 0–40)
Albumin: 4.7 g/dL (ref 3.8–4.9)
Alkaline Phosphatase: 99 IU/L (ref 44–121)
BUN/Creatinine Ratio: 15 (ref 9–23)
BUN: 12 mg/dL (ref 6–24)
Bilirubin Total: 0.3 mg/dL (ref 0.0–1.2)
CO2: 23 mmol/L (ref 20–29)
Calcium: 9.7 mg/dL (ref 8.7–10.2)
Chloride: 92 mmol/L — ABNORMAL LOW (ref 96–106)
Creatinine, Ser: 0.78 mg/dL (ref 0.57–1.00)
Globulin, Total: 2.7 g/dL (ref 1.5–4.5)
Glucose: 95 mg/dL (ref 70–99)
Potassium: 4.1 mmol/L (ref 3.5–5.2)
Sodium: 132 mmol/L — ABNORMAL LOW (ref 134–144)
Total Protein: 7.4 g/dL (ref 6.0–8.5)
eGFR: 89 mL/min/{1.73_m2} (ref >59)

## 2023-01-12 LAB — LIPID PANEL W/O CHOL/HDL RATIO
Cholesterol, Total: 204 mg/dL — ABNORMAL HIGH (ref 100–199)
HDL: 78 mg/dL (ref >39)
LDL Chol Calc (NIH): 103 mg/dL — ABNORMAL HIGH (ref 0–99)
Triglycerides: 133 mg/dL (ref 0–149)
VLDL Cholesterol Cal: 23 mg/dL (ref 5–40)

## 2023-01-12 LAB — VITAMIN D 25 HYDROXY (VIT D DEFICIENCY, FRACTURES): Vit D, 25-Hydroxy: 23.5 ng/mL — ABNORMAL LOW (ref 30.0–100.0)

## 2023-01-12 LAB — TSH: TSH: 0.934 u[IU]/mL (ref 0.450–4.500)

## 2023-01-12 NOTE — Telephone Encounter (Signed)
Med reordered 01/11/23 #30 3 RF  Requested Prescriptions  Refused Prescriptions Disp Refills   lisinopril (ZESTRIL) 10 MG tablet [Pharmacy Med Name: LISINOPRIL 10 MG TABLET] 90 tablet 1    Sig: TAKE 1 TABLET BY MOUTH EVERY DAY     Cardiovascular:  ACE Inhibitors Failed - 01/11/2023  4:49 PM      Failed - Cr in normal range and within 180 days    Creatinine, Ser  Date Value Ref Range Status  01/11/2023 0.78 0.57 - 1.00 mg/dL Final         Failed - K in normal range and within 180 days    Potassium  Date Value Ref Range Status  01/11/2023 4.1 3.5 - 5.2 mmol/L Final         Passed - Patient is not pregnant      Passed - Last BP in normal range    BP Readings from Last 1 Encounters:  01/11/23 127/82         Passed - Valid encounter within last 6 months    Recent Outpatient Visits           Yesterday Routine general medical examination at a health care facility   [facility], Megan P, DO   11 months ago Trigeminal neuralgia of right side of face   [facility] [place], Megan P, DO   1 year ago Morbid obesity [facility])   [facility] [place], Megan P, DO   1 year ago Right lower quadrant abdominal pain   [facility] [place], Avanti, MD   1 year ago Routine general medical examination at a health care facility   [facility] [place], Oralia Rud, DO       Future Appointments             In 1 month Laural Benes, Oralia Rud, DO  [facility]

## 2023-01-16 ENCOUNTER — Ambulatory Visit: Payer: BC Managed Care – PPO | Admitting: Family Medicine

## 2023-02-12 ENCOUNTER — Ambulatory Visit: Payer: BC Managed Care – PPO | Admitting: Family Medicine

## 2023-02-12 VITALS — BP 107/69 | HR 75 | Wt 207.6 lb

## 2023-02-12 DIAGNOSIS — I129 Hypertensive chronic kidney disease with stage 1 through stage 4 chronic kidney disease, or unspecified chronic kidney disease: Secondary | ICD-10-CM | POA: Diagnosis not present

## 2023-02-12 MED ORDER — LISINOPRIL-HYDROCHLOROTHIAZIDE 10-12.5 MG PO TABS: 1.0000 | ORAL_TABLET | Freq: Every day | ORAL | 1 refills | Status: DC

## 2023-02-12 NOTE — Assessment & Plan Note (Signed)
Under good control on current regimen. Continue current regimen. Continue to monitor. Call with any concerns. Refills given. Labs drawn today.   

## 2023-02-12 NOTE — Progress Notes (Signed)
BP 107/69   Pulse 75   Wt 207 lb 9.6 oz (94.2 kg)   SpO2 96%   BMI 36.33 kg/m    Subjective:    Patient ID: Angela Boone, female    DOB: January 30, 1966, 57 y.o.   MRN: [id number]  HPI: Angela Boone is a 57 y.o. female  Chief Complaint  Patient presents with   Hypertension   HYPERTENSION  Hypertension status: controlled  Satisfied with current treatment? yes Duration of hypertension: chronic BP monitoring frequency:  not checking BP medication side effects:  no Medication compliance: excellent compliance Previous BP meds: lisinopril, HCTZ Aspirin: no Recurrent headaches: no Visual changes: no Palpitations: no Dyspnea: no Chest pain: no Lower extremity edema: no Dizzy/lightheaded: no  Relevant past medical, surgical, family and social history reviewed and updated as indicated. Interim medical history since our last visit reviewed. Allergies and medications reviewed and updated.  Review of Systems  Constitutional: Negative.   Respiratory: Negative.    Cardiovascular: Negative.   Gastrointestinal: Negative.   Musculoskeletal: Negative.   Neurological: Negative.   Psychiatric/Behavioral: Negative.      Per HPI unless specifically indicated above     Objective:    BP 107/69   Pulse 75   Wt 207 lb 9.6 oz (94.2 kg)   SpO2 96%   BMI 36.33 kg/m   Wt Readings from Last 3 Encounters:  02/12/23 207 lb 9.6 oz (94.2 kg)  01/11/23 203 lb 12.8 oz (92.4 kg)  02/14/22 223 lb 9.6 oz (101.4 kg)    Physical Exam Vitals and nursing note reviewed.  Constitutional:      General: She is not in acute distress.    Appearance: Normal appearance. She is not ill-appearing, toxic-appearing or diaphoretic.  HENT:     Head: Normocephalic and atraumatic.     Right Ear: External ear normal.     Left Ear: External ear normal.     Nose: Nose normal.     Mouth/Throat:     Mouth: Mucous membranes are moist.     Pharynx: Oropharynx is clear.  Eyes:     General: No  scleral icterus.       Right eye: No discharge.        Left eye: No discharge.     Extraocular Movements: Extraocular movements intact.     Conjunctiva/sclera: Conjunctivae normal.     Pupils: Pupils are equal, round, and reactive to light.  Cardiovascular:     Rate and Rhythm: Normal rate and regular rhythm.     Pulses: Normal pulses.     Heart sounds: Normal heart sounds. No murmur heard.    No friction rub. No gallop.  Pulmonary:     Effort: Pulmonary effort is normal. No respiratory distress.     Breath sounds: Normal breath sounds. No stridor. No wheezing, rhonchi or rales.  Chest:     Chest wall: No tenderness.  Musculoskeletal:        General: Normal range of motion.     Cervical back: Normal range of motion and neck supple.  Skin:    General: Skin is warm and dry.     Capillary Refill: Capillary refill takes less than 2 seconds.     Coloration: Skin is not jaundiced or pale.     Findings: No bruising, erythema, lesion or rash.  Neurological:     General: No focal deficit present.     Mental Status: She is alert and oriented to person, place, and time. Mental  status is at baseline.  Psychiatric:        Mood and Affect: Mood normal.        Behavior: Behavior normal.        Thought Content: Thought content normal.        Judgment: Judgment normal.     Results for orders placed or performed in visit on 01/11/23  CBC with Differential/Platelet  Result Value Ref Range   WBC 6.1 3.4 - 10.8 x10E3/uL   RBC 4.67 3.77 - 5.28 x10E6/uL   Hemoglobin 14.9 11.1 - 15.9 g/dL   Hematocrit 16.1 09.6 - 46.6 %   MCV 95 79 - 97 fL   MCH 31.9 26.6 - 33.0 pg   MCHC 33.6 31.5 - 35.7 g/dL   RDW 04.5 40.9 - 81.1 %   Platelets 334 150 - 450 x10E3/uL   Neutrophils 59 Not Estab. %   Lymphs 27 Not Estab. %   Monocytes 11 Not Estab. %   Eos 2 Not Estab. %   Basos 1 Not Estab. %   Neutrophils Absolute 3.6 1.4 - 7.0 x10E3/uL   Lymphocytes Absolute 1.6 0.7 - 3.1 x10E3/uL   Monocytes  Absolute 0.7 0.1 - 0.9 x10E3/uL   EOS (ABSOLUTE) 0.1 0.0 - 0.4 x10E3/uL   Basophils Absolute 0.1 0.0 - 0.2 x10E3/uL   Immature Granulocytes 0 Not Estab. %   Immature Grans (Abs) 0.0 0.0 - 0.1 x10E3/uL  Comprehensive metabolic panel  Result Value Ref Range   Glucose 95 70 - 99 mg/dL   BUN 12 6 - 24 mg/dL   Creatinine, Ser 9.14 0.57 - 1.00 mg/dL   eGFR 89 >78 GN/FAO/1.30   BUN/Creatinine Ratio 15 9 - 23   Sodium 132 (L) 134 - 144 mmol/L   Potassium 4.1 3.5 - 5.2 mmol/L   Chloride 92 (L) 96 - 106 mmol/L   CO2 23 20 - 29 mmol/L   Calcium 9.7 8.7 - 10.2 mg/dL   Total Protein 7.4 6.0 - 8.5 g/dL   Albumin 4.7 3.8 - 4.9 g/dL   Globulin, Total 2.7 1.5 - 4.5 g/dL   Bilirubin Total 0.3 0.0 - 1.2 mg/dL   Alkaline Phosphatase 99 44 - 121 IU/L   AST 21 0 - 40 IU/L   ALT 24 0 - 32 IU/L  Lipid Panel w/o Chol/HDL Ratio  Result Value Ref Range   Cholesterol, Total 204 (H) 100 - 199 mg/dL   Triglycerides 865 0 - 149 mg/dL   HDL 78 >78 mg/dL   VLDL Cholesterol Cal 23 5 - 40 mg/dL   LDL Chol Calc (NIH) 469 (H) 0 - 99 mg/dL  TSH  Result Value Ref Range   TSH 0.934 0.450 - 4.500 uIU/mL  Microalbumin, Urine Waived  Result Value Ref Range   Microalb, Ur Waived 10 0 - 19 mg/L   Creatinine, Urine Waived 50 10 - 300 mg/dL   Microalb/Creat Ratio 30-300 (H) <30 mg/g  VITAMIN D 25 Hydroxy (Vit-D Deficiency, Fractures)  Result Value Ref Range   Vit D, 25-Hydroxy 23.5 (L) 30.0 - 100.0 ng/mL      Assessment & Plan:   Problem List Items Addressed This Visit       Genitourinary   Benign hypertensive renal disease - Primary    Under good control on current regimen. Continue current regimen. Continue to monitor. Call with any concerns. Refills given. Labs drawn today.       Relevant Orders   Basic metabolic panel     Follow  up plan: Return in about 5 months (around 07/13/2023).

## 2023-02-13 ENCOUNTER — Other Ambulatory Visit: Payer: Self-pay | Admitting: Nurse Practitioner

## 2023-02-13 LAB — BASIC METABOLIC PANEL
BUN/Creatinine Ratio: 14 (ref 9–23)
BUN: 11 mg/dL (ref 6–24)
CO2: 22 mmol/L (ref 20–29)
Calcium: 9.2 mg/dL (ref 8.7–10.2)
Chloride: 87 mmol/L — ABNORMAL LOW (ref 96–106)
Creatinine, Ser: 0.76 mg/dL (ref 0.57–1.00)
Glucose: 72 mg/dL (ref 70–99)
Potassium: 4.1 mmol/L (ref 3.5–5.2)
Sodium: 124 mmol/L — ABNORMAL LOW (ref 134–144)
eGFR: 91 mL/min/{1.73_m2} (ref >59)

## 2023-02-13 NOTE — Telephone Encounter (Signed)
Refused hydrochlorothiazide 25 mg because it was discontinued 02/12/2023.

## 2023-02-16 ENCOUNTER — Other Ambulatory Visit: Payer: Self-pay | Admitting: Family Medicine

## 2023-02-16 DIAGNOSIS — E871 Hypo-osmolality and hyponatremia: Secondary | ICD-10-CM

## 2023-03-19 DIAGNOSIS — M75102 Unspecified rotator cuff tear or rupture of left shoulder, not specified as traumatic: Secondary | ICD-10-CM | POA: Diagnosis not present

## 2023-04-16 ENCOUNTER — Encounter: Payer: Self-pay | Admitting: Family Medicine

## 2023-04-16 MED ORDER — HYDROCHLOROTHIAZIDE 12.5 MG PO TABS: 12.5000 mg | ORAL_TABLET | Freq: Every day | ORAL | 0 refills | Status: DC

## 2023-04-16 MED ORDER — LISINOPRIL 10 MG PO TABS: 10.0000 mg | ORAL_TABLET | Freq: Every day | ORAL | 0 refills | Status: DC

## 2023-04-22 ENCOUNTER — Ambulatory Visit
Admission: EM | Admit: 2023-04-22 | Discharge: 2023-04-22 | Disposition: A | Discharge status: Home / Self Care | Payer: BC Managed Care – PPO | Attending: Emergency Medicine | Admitting: Emergency Medicine

## 2023-04-22 ENCOUNTER — Encounter: Payer: Self-pay | Admitting: Emergency Medicine

## 2023-04-22 DIAGNOSIS — J069 Acute upper respiratory infection, unspecified: Secondary | ICD-10-CM

## 2023-04-22 DIAGNOSIS — J018 Other acute sinusitis: Secondary | ICD-10-CM | POA: Diagnosis not present

## 2023-04-22 DIAGNOSIS — B349 Viral infection, unspecified: Secondary | ICD-10-CM | POA: Diagnosis not present

## 2023-04-22 LAB — RESP PANEL BY RT-PCR (FLU A&B, COVID) ARPGX2
Influenza A by PCR: NEGATIVE
Influenza B by PCR: NEGATIVE
SARS Coronavirus 2 by RT PCR: NEGATIVE

## 2023-04-22 LAB — GROUP A STREP BY PCR: Group A Strep by PCR: NOT DETECTED

## 2023-04-22 NOTE — Discharge Instructions (Addendum)
Your strep, COVID and flu are all negative , most likely you have a viral illness: no antibiotic is indicated at this time, May treat with OTC meds of choice DayQuil, NyQuil, or Coricidin HBP, Chloraseptic throat lozenges while awake,etc.). Make sure to drink plenty of fluids to stay hydrated(gatorade, water, popsicles,jello,etc), avoid caffeine products. Follow up with PCP. Return as needed.

## 2023-04-22 NOTE — ED Triage Notes (Signed)
Patient sore throat, headache, and nasal congestion that started yesterday.  Patient only reports slight cough.  Patient denies fevers.

## 2023-04-22 NOTE — ED Provider Notes (Signed)
[facility]    CSN: [id number] Arrival date & time: 04/22/23  0801      History   Chief Complaint Chief Complaint  Patient presents with   Headache   Sore Throat   Nasal Congestion    HPI Angela Boone is a 57 y.o. female.   57 year old female, Angela Boone, presents to urgent care for evaluation of sore throat, HA, nasal congestion, slight cough that started yesterday. Pt denies any fevers.  PMH: Hypothyroidism, Trigeminal neuralgia, Rosacea, HTN  The history is provided by the patient. No language interpreter was used.    Past Medical History:  Diagnosis Date   Hypertension    Hypothyroidism    Rosacea    Trigeminal neuralgia     Patient Active Problem List   Diagnosis Date Noted   Viral URI with cough 04/22/2023   Morbid obesity (HCC) 09/21/2021   Right lower quadrant abdominal pain 08/16/2021   Hyperlipidemia 10/05/2016   Vitamin D deficiency 12/22/2014   Hypothyroidism 11/10/2014   Benign hypertensive renal disease 11/10/2014   Trigeminal neuralgia of right side of face 11/10/2014    Past Surgical History:  Procedure Laterality Date   BUNIONECTOMY     HAMMER TOE SURGERY Left     OB History   No obstetric history on file.      Home Medications    Prior to Admission medications   Medication Sig Start Date End Date Taking? Authorizing Provider  atorvastatin (LIPITOR) 20 MG tablet Take 1 tablet (20 mg total) by mouth daily. 01/11/23  Yes Johnson, Megan P, DO  augmented betamethasone dipropionate (DIPROLENE-AF) 0.05 % cream Apply topically 2 (two) times daily. 12/19/19   [provider]  hydrochlorothiazide (HYDRODIURIL) 12.5 MG tablet Take 1 tablet (12.5 mg total) by mouth daily. 04/16/23  Yes Johnson, Megan P, DO  levothyroxine (SYNTHROID) 75 MCG tablet Take 1 tablet (75 mcg total) by mouth daily before breakfast. 10/14/21  Yes Johnson, Megan P, DO  lisinopril (ZESTRIL) 10 MG tablet Take 1 tablet (10 mg total) by  mouth daily. 04/16/23  Yes Johnson, Megan P, DO  OXcarbazepine (TRILEPTAL) 150 MG tablet Take 450-600 mg by mouth See admin instructions. Takes 3 tablets (450mg ) orally in the morning and 4 tablets (600mg ) orally at bedtime.   Yes [provider]  triamcinolone cream (KENALOG) 0.1 % APPLY TO AFFECTED AREA 3 TIMES A DAY 12/04/22   [provider]    Family History Family History  Problem Relation Age of Onset   Cancer Mother        breast   Hypertension Mother    Breast cancer Mother 70   Cancer Father        lung   Heart disease Father    Diabetes Son 8       Type 1   Cancer Maternal Grandmother        breast   Breast cancer Maternal Grandmother 66   Cancer Maternal Grandfather        colon   Heart disease Paternal Grandfather    Hypertension Brother     Social History Social History   Tobacco Use   Smoking status: Never   Smokeless tobacco: Never  Vaping Use   Vaping status: Never Used  Substance Use Topics   Alcohol use: Yes    Comment: occas   Drug use: No     Allergies   Patient has no known allergies.   Review of Systems Review of Systems  Constitutional:  Negative for chills and fever.  HENT:  Negative for ear pain and sore throat.   Eyes:  Negative for pain and visual disturbance.  Respiratory:  Negative for cough and shortness of breath.   Cardiovascular:  Negative for chest pain and palpitations.  Gastrointestinal:  Negative for abdominal pain and vomiting.  Genitourinary:  Negative for dysuria and hematuria.  Musculoskeletal:  Negative for arthralgias and back pain.  Skin:  Negative for color change and rash.  Neurological:  Negative for seizures and syncope.  All other systems reviewed and are negative.    Physical Exam Triage Vital Signs ED Triage Vitals  Encounter Vitals Group     BP      Systolic BP Percentile      Diastolic BP Percentile      Pulse      Resp      Temp      Temp src      SpO2      Weight       Height      Head Circumference      Peak Flow      Pain Score      Pain Loc      Pain Education      Exclude from Growth Chart    No data found.  Updated Vital Signs BP (!) 163/94 (BP Location: Left Arm)   Pulse 87   Temp 98.7 F (37.1 C) (Oral)   Resp 15   Ht 5\' 3"  (1.6 m)   Wt 204 lb (92.5 kg)   SpO2 97%   BMI 36.14 kg/m   Visual Acuity Right Eye Distance:   Left Eye Distance:   Bilateral Distance:    Right Eye Near:   Left Eye Near:    Bilateral Near:     Physical Exam Vitals and nursing note reviewed.  Constitutional:      General: She is not in acute distress.    Appearance: She is well-developed and well-groomed.  HENT:     Head: Normocephalic.     Right Ear: Tympanic membrane is retracted.     Left Ear: Tympanic membrane is retracted.     Nose: Mucosal edema and congestion present.     Mouth/Throat:     Lips: Pink.     Mouth: Mucous membranes are moist.     Pharynx: Uvula midline. Posterior oropharyngeal erythema present.     Tonsils: No tonsillar exudate or tonsillar abscesses.  Eyes:     General: Lids are normal.     Conjunctiva/sclera: Conjunctivae normal.     Pupils: Pupils are equal, round, and reactive to light.  Neck:     Trachea: No tracheal deviation.  Cardiovascular:     Rate and Rhythm: Normal rate and regular rhythm.     Pulses: Normal pulses.     Heart sounds: Normal heart sounds. No murmur heard. Pulmonary:     Effort: Pulmonary effort is normal.     Breath sounds: Normal breath sounds and air entry.  Abdominal:     General: Bowel sounds are normal.     Palpations: Abdomen is soft.     Tenderness: There is no abdominal tenderness.  Musculoskeletal:        General: Normal range of motion.     Cervical back: Normal range of motion.  Lymphadenopathy:     Cervical: No cervical adenopathy.  Skin:    General: Skin is warm and dry.     Findings: No rash.  Neurological:     General: No focal deficit present.     Mental Status: She  is alert and oriented to person, place, and time.     GCS: GCS eye subscore is 4. GCS verbal subscore is 5. GCS motor subscore is 6.  Psychiatric:        Attention and Perception: Attention normal.        Mood and Affect: Mood normal.        Speech: Speech normal.        Behavior: Behavior normal. Behavior is cooperative.      UC Treatments / Results  Labs (all labs ordered are listed, but only abnormal results are displayed) Labs Reviewed  GROUP A STREP BY PCR  RESP PANEL BY RT-PCR (FLU A&B, COVID) [id number]    EKG   Radiology No results found.  Procedures Procedures (including critical care time)  Medications Ordered in UC Medications - No data to display  Initial Impression / Assessment and Plan / UC Course  I have reviewed the triage vital signs and the nursing notes.  Pertinent labs & imaging results that were available during my care of the patient were reviewed by me and considered in my medical decision making (see chart for details).  Clinical Course as of 04/22/23 4098  [facility] Apr 22, 2023  0827 Respiratory panel, strep pending [JD]  0850 Strep negative, resp panel pending. [JD]  0900 Resp panel is negative. [JD]    Clinical Course User Index [JD] Alee Katen, Para March, NP   Discussed exam findings and plan of care with patient, most likely this is viral in nature and will need to run its course, no abx at present, OTC meds recommended, strict go to ER precautions given.   Patient verbalized understanding to this provider.  Ddx: Viral illness, allergies Final Clinical Impressions(s) / UC Diagnoses   Final diagnoses:  Viral URI with cough     Discharge Instructions      Your strep, COVID and flu are all negative , most likely you have a viral illness: no antibiotic is indicated at this time, May treat with OTC meds of choice DayQuil, NyQuil, or Coricidin HBP, Chloraseptic throat lozenges while awake,etc.). Make sure to drink plenty of fluids to stay  hydrated(gatorade, water, popsicles,jello,etc), avoid caffeine products. Follow up with PCP. Return as needed.     ED Prescriptions   None    PDMP not reviewed this encounter.   Clancy Gourd, NP 04/22/23 [phone]

## 2023-05-04 ENCOUNTER — Other Ambulatory Visit: Payer: Self-pay | Admitting: Family Medicine

## 2023-05-04 DIAGNOSIS — Z1231 Encounter for screening mammogram for malignant neoplasm of breast: Secondary | ICD-10-CM

## 2023-05-22 ENCOUNTER — Ambulatory Visit
Admission: RE | Admit: 2023-05-22 | Discharge: 2023-05-22 | Disposition: A | Discharge status: Home / Self Care | Payer: BC Managed Care – PPO | Source: Ambulatory Visit | Attending: Family Medicine | Admitting: Family Medicine

## 2023-05-22 DIAGNOSIS — Z1231 Encounter for screening mammogram for malignant neoplasm of breast: Secondary | ICD-10-CM | POA: Diagnosis not present

## 2023-05-23 ENCOUNTER — Encounter: Payer: Self-pay | Admitting: Family Medicine

## 2023-05-24 DIAGNOSIS — S93402A Sprain of unspecified ligament of left ankle, initial encounter: Secondary | ICD-10-CM | POA: Diagnosis not present

## 2023-06-19 DIAGNOSIS — S93402D Sprain of unspecified ligament of left ankle, subsequent encounter: Secondary | ICD-10-CM | POA: Diagnosis not present

## 2023-06-27 DIAGNOSIS — L249 Irritant contact dermatitis, unspecified cause: Secondary | ICD-10-CM | POA: Diagnosis not present

## 2023-06-27 DIAGNOSIS — K130 Diseases of lips: Secondary | ICD-10-CM | POA: Diagnosis not present

## 2023-07-09 LAB — BASIC METABOLIC PANEL WITH GFR
BUN: 12 (ref 4–21)
Chloride: 93 — AB (ref 99–108)
Creatinine: 0.8 (ref 0.5–1.1)
Glucose: 93
Potassium: 4.7 meq/L (ref 3.5–5.1)
Sodium: 130 — AB (ref 137–147)

## 2023-07-09 LAB — CBC AND DIFFERENTIAL
HCT: 42 (ref 36–46)
Hemoglobin: 14.3 (ref 12.0–16.0)
Platelets: 315 10*3/uL (ref 150–400)
WBC: 4.4

## 2023-07-09 LAB — TSH: TSH: 1.34 (ref 0.41–5.90)

## 2023-07-09 LAB — VITAMIN B12: Vitamin B-12: 24.8

## 2023-07-09 LAB — HEPATIC FUNCTION PANEL
ALT: 22 U/L (ref 7–35)
AST: 21 (ref 13–35)
Alkaline Phosphatase: 89 (ref 25–125)
Bilirubin, Total: 0.5

## 2023-07-09 LAB — HEMOGLOBIN A1C: Hemoglobin A1C: 5.7

## 2023-07-09 LAB — COMPREHENSIVE METABOLIC PANEL WITH GFR
Albumin: 4.6 (ref 3.5–5.0)
Calcium: 9.4 (ref 8.7–10.7)
Globulin: 2.2
eGFR: 87

## 2023-07-09 LAB — LIPID PANEL
Cholesterol: 199 (ref 0–200)
HDL: 70 (ref 35–70)
LDL Cholesterol: 109
Triglycerides: 111 (ref 40–160)

## 2023-07-09 LAB — CBC: RBC: 4.44 (ref 3.87–5.11)

## 2023-07-11 ENCOUNTER — Other Ambulatory Visit: Payer: Self-pay | Admitting: Family Medicine

## 2023-07-12 ENCOUNTER — Other Ambulatory Visit: Payer: Self-pay | Admitting: Family Medicine

## 2023-07-12 NOTE — Telephone Encounter (Signed)
 Requested Prescriptions  Pending Prescriptions Disp Refills   hydrochlorothiazide (HYDRODIURIL) 12.5 MG tablet [Pharmacy Med Name: HYDROCHLOROTHIAZIDE 12.5 MG TB] 90 tablet 0    Sig: TAKE 1 TABLET BY MOUTH EVERY DAY     Cardiovascular: Diuretics - Thiazide Failed - 07/12/2023 12:23 PM      Failed - Na in normal range and within 180 days    Sodium  Date Value Ref Range Status  02/12/2023 124 (L) 134 - 144 mmol/L Final         Failed - Last BP in normal range    BP Readings from Last 1 Encounters:  04/22/23 (!) 163/94         Passed - Cr in normal range and within 180 days    Creatinine, Ser  Date Value Ref Range Status  02/12/2023 0.76 0.57 - 1.00 mg/dL Final         Passed - K in normal range and within 180 days    Potassium  Date Value Ref Range Status  02/12/2023 4.1 3.5 - 5.2 mmol/L Final         Passed - Valid encounter within last 6 months    Recent Outpatient Visits           5 months ago Benign hypertensive renal disease   [place] [facility] [place], Megan P, DO   6 months ago Routine general medical examination at a health care facility   [facility] [place], [place] P, DO   1 year ago Trigeminal neuralgia of right side of face   [place] [facility] [place], Megan P, DO   1 year ago Morbid obesity [facility])   [place] [facility] [place], Megan P, DO   1 year ago Right lower quadrant abdominal pain   [place] [facility] V[place], Avanti, MD       Future Appointments             In 4 weeks Laural Benes, Oralia Rud, DO [place] [facility]

## 2023-07-13 ENCOUNTER — Ambulatory Visit: Payer: Self-pay | Admitting: Family Medicine

## 2023-08-05 ENCOUNTER — Other Ambulatory Visit: Payer: Self-pay | Admitting: Family Medicine

## 2023-08-07 NOTE — Telephone Encounter (Signed)
 Requested medication (s) are due for refill today: yes  Requested medication (s) are on the active medication list: yes  Last refill:  07/11/23 #30 tabs  Future visit scheduled: yes  Notes to clinic:  pharmacy request for 90 day refills   Requested Prescriptions  Pending Prescriptions Disp Refills   lisinopril (ZESTRIL) 10 MG tablet [Pharmacy Med Name: LISINOPRIL 10 MG TABLET] 90 tablet 1    Sig: TAKE 1 TABLET BY MOUTH EVERY DAY     Cardiovascular:  ACE Inhibitors Failed - 08/07/2023  9:57 AM      Failed - Last BP in normal range    BP Readings from Last 1 Encounters:  04/22/23 (!) 163/94         Failed - Valid encounter within last 6 months    Recent Outpatient Visits   None     Future Appointments             In 2 days Laural Benes, Megan P, DO [place] [facility]            Passed - Cr in normal range and within 180 days    Creatinine, Ser  Date Value Ref Range Status  02/12/2023 0.76 0.57 - 1.00 mg/dL Final         Passed - K in normal range and within 180 days    Potassium  Date Value Ref Range Status  02/12/2023 4.1 3.5 - 5.2 mmol/L Final         Passed - Patient is not pregnant

## 2023-08-09 ENCOUNTER — Ambulatory Visit: Admitting: Family Medicine

## 2023-08-09 ENCOUNTER — Other Ambulatory Visit: Payer: Self-pay | Admitting: Family Medicine

## 2023-08-09 ENCOUNTER — Encounter: Payer: Self-pay | Admitting: Family Medicine

## 2023-08-09 VITALS — BP 124/78 | HR 86 | Ht 64.0 in | Wt 217.0 lb

## 2023-08-09 DIAGNOSIS — E782 Mixed hyperlipidemia: Secondary | ICD-10-CM | POA: Diagnosis not present

## 2023-08-09 DIAGNOSIS — I129 Hypertensive chronic kidney disease with stage 1 through stage 4 chronic kidney disease, or unspecified chronic kidney disease: Secondary | ICD-10-CM

## 2023-08-09 DIAGNOSIS — E039 Hypothyroidism, unspecified: Secondary | ICD-10-CM | POA: Diagnosis not present

## 2023-08-09 DIAGNOSIS — E559 Vitamin D deficiency, unspecified: Secondary | ICD-10-CM

## 2023-08-09 DIAGNOSIS — Z1211 Encounter for screening for malignant neoplasm of colon: Secondary | ICD-10-CM

## 2023-08-09 DIAGNOSIS — R1900 Intra-abdominal and pelvic swelling, mass and lump, unspecified site: Secondary | ICD-10-CM

## 2023-08-09 MED ORDER — LEVOTHYROXINE SODIUM 75 MCG PO TABS: 75.0000 ug | ORAL_TABLET | Freq: Every day | ORAL | 3 refills | Status: DC

## 2023-08-09 MED ORDER — LISINOPRIL 10 MG PO TABS: 10.0000 mg | ORAL_TABLET | Freq: Every day | ORAL | 1 refills | Status: DC

## 2023-08-09 MED ORDER — ATORVASTATIN CALCIUM 20 MG PO TABS: 20.0000 mg | ORAL_TABLET | Freq: Every day | ORAL | 1 refills | Status: DC

## 2023-08-09 MED ORDER — HYDROCHLOROTHIAZIDE 12.5 MG PO TABS: 12.5000 mg | ORAL_TABLET | Freq: Every day | ORAL | 1 refills | Status: DC

## 2023-08-09 MED ORDER — TIRZEPATIDE-WEIGHT MANAGEMENT 2.5 MG/0.5ML ~~LOC~~ SOLN: 2.5000 mg | SUBCUTANEOUS | 0 refills | Status: DC

## 2023-08-09 MED ORDER — TIRZEPATIDE-WEIGHT MANAGEMENT 5 MG/0.5ML ~~LOC~~ SOLN: 5.0000 mg | SUBCUTANEOUS | 1 refills | Status: DC

## 2023-08-09 NOTE — Assessment & Plan Note (Signed)
 Under good control on current regimen. Continue current regimen. Continue to monitor. Call with any concerns. Refills given. Labs drawn through work and normal.

## 2023-08-09 NOTE — Telephone Encounter (Signed)
 Requested Prescriptions  Pending Prescriptions Disp Refills   lisinopril (ZESTRIL) 10 MG tablet [Pharmacy Med Name: LISINOPRIL 10 MG TABLET] 90 tablet 0    Sig: TAKE 1 TABLET BY MOUTH EVERY DAY     Cardiovascular:  ACE Inhibitors Failed - 08/09/2023  2:35 PM      Failed - Last BP in normal range    BP Readings from Last 1 Encounters:  04/22/23 (!) 163/94         Passed - Cr in normal range and within 180 days    Creatinine, Ser  Date Value Ref Range Status  02/12/2023 0.76 0.57 - 1.00 mg/dL Final         Passed - K in normal range and within 180 days    Potassium  Date Value Ref Range Status  02/12/2023 4.1 3.5 - 5.2 mmol/L Final         Passed - Patient is not pregnant      Passed - Valid encounter within last 6 months    Recent Outpatient Visits           Today    [place] [facility] Haslet, Ettrick, DO

## 2023-08-09 NOTE — Assessment & Plan Note (Signed)
 Under good control on last check. Refills given today. Call with any concerns.

## 2023-08-09 NOTE — Progress Notes (Signed)
 BP 124/78 (BP Location: Left Arm, Patient Position: Sitting, Cuff Size: Large)   Pulse 86   Ht 5\' 4"  (1.626 m)   Wt 217 lb (98.4 kg)   BMI 37.25 kg/m    Subjective:    Patient ID: Angela Boone, female    DOB: 05-01-1966, 58 y.o.   MRN: [id number]  HPI: Angela Boone is a 58 y.o. female  Chief Complaint  Patient presents with   Hypertension   Hypothyroidism   OBESITY Duration: chronic Previous attempts at weight loss: diet, exercise, portion control Complications of obesity: HLD, HTN,  Peak weight: current (217) Weight loss goal: 170 Weight loss to date: none Requesting obesity pharmacotherapy: yes Current weight loss supplements/medications: no Previous weight loss supplements/meds: no  HYPERTENSION / HYPERLIPIDEMIA Satisfied with current treatment? yes Duration of hypertension: chronic BP monitoring frequency: not checking BP medication side effects: no Past BP meds: lisinopril HCTZ Duration of hyperlipidemia: chronic Cholesterol medication side effects: no Cholesterol supplements: none Past cholesterol medications: atorvastatin Medication compliance: excellent compliance Aspirin: no Recent stressors: no Recurrent headaches: no Visual changes: no Palpitations: no Dyspnea: no Chest pain: no Lower extremity edema: no Dizzy/lightheaded: no  HYPOTHYROIDISM Thyroid control status:controlled Satisfied with current treatment? yes Medication side effects: no Medication compliance: excellent compliance Recent dose adjustment:no Fatigue: no Cold intolerance: no Heat intolerance: no Weight gain: no Weight loss: no Constipation: no Diarrhea/loose stools: no Palpitations: no Lower extremity edema: no Anxiety/depressed mood: no  LUMP Duration: months Location: suprapubic Onset: gradual Painful: no Discomfort: no Status:  bigger Trauma: no Redness: no Bruising: no Recent infection: no Swollen lymph nodes: no Requesting removal:  yes History of cancer: no Family history of cancer: no History of the same: no  Relevant past medical, surgical, family and social history reviewed and updated as indicated. Interim medical history since our last visit reviewed. Allergies and medications reviewed and updated.  Review of Systems  Constitutional: Negative.   Respiratory: Negative.    Cardiovascular: Negative.   Gastrointestinal: Negative.   Musculoskeletal: Negative.   Neurological: Negative.   Psychiatric/Behavioral: Negative.      Per HPI unless specifically indicated above     Objective:    BP 124/78 (BP Location: Left Arm, Patient Position: Sitting, Cuff Size: Large)   Pulse 86   Ht 5\' 4"  (1.626 m)   Wt 217 lb (98.4 kg)   BMI 37.25 kg/m   Wt Readings from Last 3 Encounters:  08/09/23 217 lb (98.4 kg)  04/22/23 204 lb (92.5 kg)  02/12/23 207 lb 9.6 oz (94.2 kg)    Physical Exam Vitals and nursing note reviewed.  Constitutional:      General: She is not in acute distress.    Appearance: Normal appearance. She is obese. She is not ill-appearing, toxic-appearing or diaphoretic.  HENT:     Head: Normocephalic and atraumatic.     Right Ear: External ear normal.     Left Ear: External ear normal.     Nose: Nose normal.     Mouth/Throat:     Mouth: Mucous membranes are moist.     Pharynx: Oropharynx is clear.  Eyes:     General: No scleral icterus.       Right eye: No discharge.        Left eye: No discharge.     Extraocular Movements: Extraocular movements intact.     Conjunctiva/sclera: Conjunctivae normal.     Pupils: Pupils are equal, round, and reactive to light.  Cardiovascular:  Rate and Rhythm: Normal rate and regular rhythm.     Pulses: Normal pulses.     Heart sounds: Normal heart sounds. No murmur heard.    No friction rub. No gallop.  Pulmonary:     Effort: Pulmonary effort is normal. No respiratory distress.     Breath sounds: Normal breath sounds. No stridor. No wheezing,  rhonchi or rales.  Chest:     Chest wall: No tenderness.  Musculoskeletal:        General: Normal range of motion.     Cervical back: Normal range of motion and neck supple.  Skin:    General: Skin is warm and dry.     Capillary Refill: Capillary refill takes less than 2 seconds.     Coloration: Skin is not jaundiced or pale.     Findings: No bruising, erythema, lesion or rash.     Comments: Fleshy soft 3 in x 1 inch mass on anterior belly suprapubically  Neurological:     General: No focal deficit present.     Mental Status: She is alert and oriented to person, place, and time. Mental status is at baseline.  Psychiatric:        Mood and Affect: Mood normal.        Behavior: Behavior normal.        Thought Content: Thought content normal.        Judgment: Judgment normal.     Results for orders placed or performed in visit on 08/09/23  CBC and differential   Collection Time: 07/09/23 12:00 AM  Result Value Ref Range   Hemoglobin 14.3 12.0 - 16.0   HCT 42 36 - 46   Platelets 315 150 - 400 K/uL   WBC 4.4   CBC   Collection Time: 07/09/23 12:00 AM  Result Value Ref Range   RBC 4.44 3.87 - 5.11  Basic metabolic panel with GFR   Collection Time: 07/09/23 12:00 AM  Result Value Ref Range   Glucose 93    BUN 12 4 - 21   Creatinine 0.8 0.5 - 1.1   Potassium 4.7 3.5 - 5.1 mEq/L   Sodium 130 (A) 137 - 147   Chloride 93 (A) 99 - 108  Comprehensive metabolic panel with GFR   Collection Time: 07/09/23 12:00 AM  Result Value Ref Range   Globulin 2.2    eGFR 87    Calcium 9.4 8.7 - 10.7   Albumin 4.6 3.5 - 5.0  Lipid panel   Collection Time: 07/09/23 12:00 AM  Result Value Ref Range   Triglycerides 111 40 - 160   Cholesterol 199 0 - 200   HDL 70 35 - 70   LDL Cholesterol 109   Hepatic function panel   Collection Time: 07/09/23 12:00 AM  Result Value Ref Range   Alkaline Phosphatase 89 25 - 125   ALT 22 7 - 35 U/L   AST 21 13 - 35   Bilirubin, Total 0.5   Vitamin B12    Collection Time: 07/09/23 12:00 AM  Result Value Ref Range   Vitamin B-12 24.8   Hemoglobin A1c   Collection Time: 07/09/23 12:00 AM  Result Value Ref Range   Hemoglobin A1C 5.7   TSH   Collection Time: 07/09/23 12:00 AM  Result Value Ref Range   TSH 1.34 0.41 - 5.90      Assessment & Plan:   Problem List Items Addressed This Visit       Endocrine  Hypothyroidism - Primary   Under good control on last check. Refills given today. Call with any concerns.       Relevant Medications   levothyroxine (SYNTHROID) 75 MCG tablet     Genitourinary   Benign hypertensive renal disease   Under good control on current regimen. Continue current regimen. Continue to monitor. Call with any concerns. Refills given. Labs drawn through work and normal.           Other   Vitamin D deficiency   Under good control on current regimen. Continue current regimen. Continue to monitor. Call with any concerns. Refills given. Labs drawn through work and normal.        Hyperlipidemia   Under good control on current regimen. Continue current regimen. Continue to monitor. Call with any concerns. Refills given. Labs drawn through work and normal.        Relevant Medications   atorvastatin (LIPITOR) 20 MG tablet   hydrochlorothiazide (HYDRODIURIL) 12.5 MG tablet   lisinopril (ZESTRIL) 10 MG tablet   Morbid obesity (HCC)   Would like to start vial zepbound. Rx sent to [facility]. Recheck tolerance in 6 weeks. Call with any concerns.       Relevant Medications   tirzepatide (ZEPBOUND) 2.5 MG/0.5ML injection vial   tirzepatide 5 MG/0.5ML injection vial (Start on 09/06/2023)   Other Visit Diagnoses       Abdominal mass, unspecified abdominal location       Will send for [place]. Await results. Treat as needed.   Relevant Orders   US Abdomen Limited     Screening for colon cancer       Cologuard ordered.   Relevant Orders   Cologuard        Follow up plan: Return in about 6 weeks (around  09/20/2023).

## 2023-08-09 NOTE — Assessment & Plan Note (Signed)
 Would like to start vial zepbound. Rx sent to [facility]. Recheck tolerance in 6 weeks. Call with any concerns.

## 2023-08-15 ENCOUNTER — Encounter: Payer: Self-pay | Admitting: Family Medicine

## 2023-08-22 ENCOUNTER — Ambulatory Visit
Admission: RE | Admit: 2023-08-22 | Discharge: 2023-08-22 | Disposition: A | Discharge status: Home / Self Care | Source: Ambulatory Visit | Attending: Family Medicine | Admitting: Family Medicine

## 2023-08-22 ENCOUNTER — Other Ambulatory Visit: Payer: Self-pay | Admitting: Family Medicine

## 2023-08-22 DIAGNOSIS — E039 Hypothyroidism, unspecified: Secondary | ICD-10-CM

## 2023-08-22 DIAGNOSIS — R1900 Intra-abdominal and pelvic swelling, mass and lump, unspecified site: Secondary | ICD-10-CM | POA: Insufficient documentation

## 2023-08-22 DIAGNOSIS — E559 Vitamin D deficiency, unspecified: Secondary | ICD-10-CM

## 2023-08-22 DIAGNOSIS — Z1211 Encounter for screening for malignant neoplasm of colon: Secondary | ICD-10-CM

## 2023-08-22 DIAGNOSIS — E782 Mixed hyperlipidemia: Secondary | ICD-10-CM

## 2023-08-22 DIAGNOSIS — I129 Hypertensive chronic kidney disease with stage 1 through stage 4 chronic kidney disease, or unspecified chronic kidney disease: Secondary | ICD-10-CM

## 2023-08-23 MED ORDER — TIRZEPATIDE-WEIGHT MANAGEMENT 2.5 MG/0.5ML ~~LOC~~ SOLN: 2.5000 mg | SUBCUTANEOUS | 1 refills | Status: DC

## 2023-09-03 ENCOUNTER — Encounter: Payer: Self-pay | Admitting: Nurse Practitioner

## 2023-09-03 NOTE — Progress Notes (Signed)
 Contacted via MyChart   Good afternoon Angela Boone, your ultrasound returned and the area Dr. Lincoln Renshaw examined appears to be a lipoma.  This is a fat-filled type of cyst, they are benign in nature.  I will leave result for Dr. Lincoln Renshaw to review upon return to office and if any changes needed she will alert you.  Any questions?

## 2023-09-09 ENCOUNTER — Other Ambulatory Visit: Payer: Self-pay | Admitting: Family Medicine

## 2023-09-09 ENCOUNTER — Encounter: Payer: Self-pay | Admitting: Family Medicine

## 2023-09-09 DIAGNOSIS — R1900 Intra-abdominal and pelvic swelling, mass and lump, unspecified site: Secondary | ICD-10-CM

## 2023-09-09 DIAGNOSIS — D171 Benign lipomatous neoplasm of skin and subcutaneous tissue of trunk: Secondary | ICD-10-CM

## 2023-09-12 ENCOUNTER — Encounter: Payer: Self-pay | Admitting: Family Medicine

## 2023-09-21 NOTE — Telephone Encounter (Signed)
 appt

## 2023-09-25 NOTE — Telephone Encounter (Signed)
 Called patient left message for patient to call office and schedule appt.

## 2023-10-04 DIAGNOSIS — Z1211 Encounter for screening for malignant neoplasm of colon: Secondary | ICD-10-CM | POA: Diagnosis not present

## 2023-10-09 ENCOUNTER — Ambulatory Visit: Payer: Self-pay | Admitting: Family Medicine

## 2023-10-09 LAB — COLOGUARD: COLOGUARD: NEGATIVE

## 2023-12-05 DIAGNOSIS — M75102 Unspecified rotator cuff tear or rupture of left shoulder, not specified as traumatic: Secondary | ICD-10-CM | POA: Diagnosis not present

## 2023-12-17 DIAGNOSIS — M12811 Other specific arthropathies, not elsewhere classified, right shoulder: Secondary | ICD-10-CM | POA: Diagnosis not present

## 2024-02-20 ENCOUNTER — Other Ambulatory Visit: Payer: Self-pay | Admitting: Family Medicine

## 2024-02-22 NOTE — Telephone Encounter (Signed)
 Courtesy refill. Requested Prescriptions  Pending Prescriptions Disp Refills   lisinopril  (ZESTRIL ) 10 MG tablet [Pharmacy Med Name: LISINOPRIL  10 MG TABLET] 90 tablet 1    Sig: TAKE 1 TABLET BY MOUTH EVERY DAY     Cardiovascular:  ACE Inhibitors Failed - 02/22/2024 12:36 PM      Failed - Cr in normal range and within 180 days    Creatinine  Date Value Ref Range Status  07/09/2023 0.8 0.5 - 1.1 Final   Creatinine, Ser  Date Value Ref Range Status  02/12/2023 0.76 0.57 - 1.00 mg/dL Final         Failed - K in normal range and within 180 days    Potassium  Date Value Ref Range Status  07/09/2023 4.7 3.5 - 5.1 mEq/L Final         Failed - Valid encounter within last 6 months    Recent Outpatient Visits           6 months ago Hypothyroidism, unspecified type   [place] [facility] [place], [place], DO              Passed - Patient is not pregnant      Passed - Last BP in normal range    BP Readings from Last 1 Encounters:  08/09/23 124/78

## 2024-03-03 ENCOUNTER — Encounter: Payer: Self-pay | Admitting: Family Medicine

## 2024-03-04 ENCOUNTER — Other Ambulatory Visit (HOSPITAL_COMMUNITY): Payer: Self-pay

## 2024-03-04 MED ORDER — TIRZEPATIDE-WEIGHT MANAGEMENT 5 MG/0.5ML ~~LOC~~ SOLN: 5.0000 mg | SUBCUTANEOUS | 1 refills | Status: DC

## 2024-03-04 NOTE — Addendum Note (Signed)
 Addended by: Lorelei Heikkila M on: 03/04/2024 08:59 AM   Modules accepted: Orders

## 2024-03-06 ENCOUNTER — Other Ambulatory Visit (HOSPITAL_COMMUNITY): Payer: Self-pay

## 2024-03-06 ENCOUNTER — Telehealth: Payer: Self-pay | Admitting: Pharmacy Technician

## 2024-03-06 NOTE — Telephone Encounter (Signed)
  Pharmacy Patient Advocate Encounter   Received notification from Patient Advice Request messages that prior authorization for Zepbound  is required/requested.   Insurance verification completed.   The patient is insured through [facility].   Per test claim: PA required; PA submitted to above mentioned insurance via Prompt PA Key/confirmation #/EOC [id number] Status is pending

## 2024-03-06 NOTE — Telephone Encounter (Signed)
 PA request has been Received. New Encounter has been or will be created for follow up. For additional info see Pharmacy Prior Auth telephone encounter from 03/06/24.

## 2024-03-10 ENCOUNTER — Other Ambulatory Visit (HOSPITAL_COMMUNITY): Payer: Self-pay

## 2024-03-13 NOTE — Telephone Encounter (Signed)
 Additional information has been requested from the patient's insurance in order to proceed with the prior authorization request.   They are saying they need documentation from within the last 3 months that support her diagnosis and Current BMI.  I sent the notes from the office visit in April, b/c that was the most recent one I saw. She may need to have an appointment.  Please advise.

## 2024-03-14 NOTE — Telephone Encounter (Signed)
 Can we please help schedule her for a nurse visit for weight check. If she asks advise its for the medication prior authorization and insurance is requiring updated information to approve.

## 2024-03-14 NOTE — Telephone Encounter (Signed)
 Called patient and left a message for her to call back to get scheduled for a Nurse visit for weight check.

## 2024-03-17 ENCOUNTER — Other Ambulatory Visit: Payer: Self-pay | Admitting: Family Medicine

## 2024-03-17 NOTE — Telephone Encounter (Signed)
 Scheduled

## 2024-03-17 NOTE — Telephone Encounter (Signed)
 See encounter from 11/6. Looks like you guys are already working on what's needed.

## 2024-03-18 ENCOUNTER — Ambulatory Visit

## 2024-03-20 ENCOUNTER — Other Ambulatory Visit (HOSPITAL_COMMUNITY): Payer: Self-pay

## 2024-03-20 NOTE — Telephone Encounter (Signed)
 Completed form that had been sent previously and faxed back to [employer] [phone]

## 2024-03-20 NOTE — Progress Notes (Signed)
 Patient seen in office on 03/18/2024 for updated weight check needed for updated medication authorization.   Last Weight  Most recent update: 03/18/2024  8:25 AM    Weight  98.7 kg (217 lb 9.6 oz)            Body mass index is 39.34 kg/m.

## 2024-03-20 NOTE — Telephone Encounter (Signed)
 Courtesy refill given, appointment needed.   Requested Prescriptions  Pending Prescriptions Disp Refills   lisinopril  (ZESTRIL ) 10 MG tablet [Pharmacy Med Name: LISINOPRIL  10 MG TABLET] 90 tablet 1    Sig: TAKE 1 TABLET BY MOUTH EVERY DAY     Cardiovascular:  ACE Inhibitors Failed - 03/20/2024 11:49 AM      Failed - Cr in normal range and within 180 days    Creatinine  Date Value Ref Range Status  07/09/2023 0.8 0.5 - 1.1 Final   Creatinine, Ser  Date Value Ref Range Status  02/12/2023 0.76 0.57 - 1.00 mg/dL Final         Failed - K in normal range and within 180 days    Potassium  Date Value Ref Range Status  07/09/2023 4.7 3.5 - 5.1 mEq/L Final         Failed - Valid encounter within last 6 months    Recent Outpatient Visits           7 months ago Hypothyroidism, unspecified type   [place] [place], [place], [place]              Passed - Patient is not pregnant      Passed - Last BP in normal range    BP Readings from Last 1 Encounters:  08/09/23 124/78

## 2024-03-21 ENCOUNTER — Other Ambulatory Visit (HOSPITAL_COMMUNITY): Payer: Self-pay

## 2024-03-21 NOTE — Telephone Encounter (Signed)
 Pharmacy Patient Advocate Encounter  Received notification from [employer] that Prior Authorization for ZEPBOUND  has been APPROVED from 03/21/24 to 11/18/24. Ran test claim, Copay is $4. This test claim was processed through [facility]- copay amounts may vary at other pharmacies due to pharmacy/plan contracts, or as the patient moves through the different stages of their insurance plan.   PA #/Case ID/Reference #: [id number]

## 2024-04-02 MED ORDER — ZEPBOUND 5 MG/0.5ML ~~LOC~~ SOAJ: 5.0000 mg | SUBCUTANEOUS | 1 refills | Status: DC

## 2024-04-02 MED ORDER — ZEPBOUND 2.5 MG/0.5ML ~~LOC~~ SOAJ: 2.5000 mg | SUBCUTANEOUS | 0 refills | Status: DC

## 2024-04-02 NOTE — Addendum Note (Signed)
 Addended by: VICCI DUWAINE SQUIBB on: 04/02/2024 12:58 PM   Modules accepted: Orders

## 2024-04-22 DIAGNOSIS — B349 Viral infection, unspecified: Secondary | ICD-10-CM | POA: Diagnosis not present

## 2024-05-27 ENCOUNTER — Encounter: Payer: Self-pay | Admitting: Family Medicine

## 2024-05-30 ENCOUNTER — Encounter: Payer: Self-pay | Admitting: Family Medicine

## 2024-05-30 ENCOUNTER — Other Ambulatory Visit: Payer: Self-pay | Admitting: Family Medicine

## 2024-05-30 MED ORDER — ZEPBOUND 5 MG/0.5ML ~~LOC~~ SOAJ: 5.0000 mg | SUBCUTANEOUS | 0 refills | Status: DC

## 2024-06-02 NOTE — Telephone Encounter (Signed)
 Requested medication (s) are due for refill today: na   Requested medication (s) are on the active medication list: yes   Last refill:  05/30/24 #2 ml 0 refills  Future visit scheduled: no   Notes to clinic:   medication not assigned to a protocol. Pharmacy comment: Alternative Requested:NOT COVERED, PLEASE CONTACT INSURANCE, THANKS.      Requested Prescriptions  Pending Prescriptions Disp Refills   ZEPBOUND  5 MG/0.5ML Pen [Pharmacy Med Name: ZEPBOUND  5 MG/0.5 ML PEN]  0    Sig: INJECT 5 MG SUBCUTANEOUSLY WEEKLY     Off-Protocol Failed - 06/02/2024  3:01 PM      Failed - Medication not assigned to a protocol, review manually.      Passed - Valid encounter within last 12 months    Recent Outpatient Visits           9 months ago Hypothyroidism, unspecified type   [facility] [place], [place], [place]

## 2024-06-03 NOTE — Telephone Encounter (Signed)
 She requested this, but has not been seen since it was started- please find out where she needs it sent or if she is paying out of pocket.

## 2024-06-04 ENCOUNTER — Ambulatory Visit: Admitting: Family Medicine

## 2024-06-04 ENCOUNTER — Encounter: Payer: Self-pay | Admitting: Family Medicine

## 2024-06-04 DIAGNOSIS — Z789 Other specified health status: Secondary | ICD-10-CM | POA: Diagnosis not present

## 2024-06-04 DIAGNOSIS — E039 Hypothyroidism, unspecified: Secondary | ICD-10-CM

## 2024-06-04 DIAGNOSIS — E782 Mixed hyperlipidemia: Secondary | ICD-10-CM | POA: Diagnosis not present

## 2024-06-04 DIAGNOSIS — I129 Hypertensive chronic kidney disease with stage 1 through stage 4 chronic kidney disease, or unspecified chronic kidney disease: Secondary | ICD-10-CM | POA: Diagnosis not present

## 2024-06-04 DIAGNOSIS — E559 Vitamin D deficiency, unspecified: Secondary | ICD-10-CM | POA: Diagnosis not present

## 2024-06-04 MED ORDER — LISINOPRIL 10 MG PO TABS: 10.0000 mg | ORAL_TABLET | Freq: Every day | ORAL | 1 refills | Status: AC

## 2024-06-04 MED ORDER — ZEPBOUND 5 MG/0.5ML ~~LOC~~ SOAJ: 5.0000 mg | SUBCUTANEOUS | 2 refills | Status: AC

## 2024-06-04 MED ORDER — HYDROCHLOROTHIAZIDE 12.5 MG PO TABS: 12.5000 mg | ORAL_TABLET | Freq: Every day | ORAL | 1 refills | Status: AC

## 2024-06-04 MED ORDER — ATORVASTATIN CALCIUM 20 MG PO TABS: 20.0000 mg | ORAL_TABLET | Freq: Every day | ORAL | 1 refills | Status: AC

## 2024-06-04 NOTE — Progress Notes (Unsigned)
 "  BP 117/76 (BP Location: Left Arm, Patient Position: Sitting, Cuff Size: Normal)   Pulse (!) 45   Temp 97.8 F (36.6 C) (Oral)   Resp 17   Ht 5' 2.36 (1.584 m)   Wt 198 lb 9.6 oz (90.1 kg)   SpO2 98%   BMI 35.90 kg/m    Subjective:    Patient ID: Angela Boone, female    DOB: 1966/02/10, 59 y.o.   MRN: [id number]  HPI: Angela Boone is a 59 y.o. female  Chief Complaint  Patient presents with   Obesity   Hypertension   Hyperlipidemia   Hypothyroidism   OBESITY- has been on zepbound  for 2 months on the 2.5mg  Feeling well. No side effects Duration: chronic Previous attempts at weight loss: diet, exercise, portion control Complications of obesity: HLD, HTN Peak weight: 217 Weight loss goal: 170 Weight loss to date: 19lbs Requesting obesity pharmacotherapy: yes Current weight loss supplements/medications: yes Previous weight loss supplements/meds: no  HYPERTENSION / HYPERLIPIDEMIA Satisfied with current treatment? yes Duration of hypertension: chronic BP monitoring frequency: not checking BP medication side effects: no Past BP meds: lisinopril , HCTZ Duration of hyperlipidemia: chronic Cholesterol medication side effects: no Cholesterol supplements: none Past cholesterol medications: atorvastatin  Medication compliance: excellent compliance Aspirin: no Recent stressors: no Recurrent headaches: no Visual changes: no Palpitations: no Dyspnea: no Chest pain: no Lower extremity edema: no Dizzy/lightheaded: no  HYPOTHYROIDISM Thyroid  control status:controlled Satisfied with current treatment? yes Medication side effects: no Medication compliance: excellent compliance Recent dose adjustment:no Fatigue: no Cold intolerance: no Heat intolerance: no Weight gain: no Weight loss: no Constipation: no Diarrhea/loose stools: no Palpitations: no Lower extremity edema: no Anxiety/depressed mood: no  Relevant past medical, surgical, family and social  history reviewed and updated as indicated. Interim medical history since our last visit reviewed. Allergies and medications reviewed and updated.  Review of Systems  Constitutional: Negative.   Respiratory: Negative.    Cardiovascular: Negative.   Musculoskeletal: Negative.   Neurological: Negative.   Psychiatric/Behavioral: Negative.      Per HPI unless specifically indicated above     Objective:    BP 117/76 (BP Location: Left Arm, Patient Position: Sitting, Cuff Size: Normal)   Pulse (!) 45   Temp 97.8 F (36.6 C) (Oral)   Resp 17   Ht 5' 2.36 (1.584 m)   Wt 198 lb 9.6 oz (90.1 kg)   SpO2 98%   BMI 35.90 kg/m   Wt Readings from Last 3 Encounters:  06/04/24 198 lb 9.6 oz (90.1 kg)  03/18/24 217 lb 9.6 oz (98.7 kg)  08/09/23 217 lb (98.4 kg)    Physical Exam Vitals and nursing note reviewed.  Constitutional:      General: She is not in acute distress.    Appearance: Normal appearance. She is not ill-appearing, toxic-appearing or diaphoretic.  HENT:     Head: Normocephalic and atraumatic.     Right Ear: External ear normal.     Left Ear: External ear normal.     Nose: Nose normal.     Mouth/Throat:     Mouth: Mucous membranes are moist.     Pharynx: Oropharynx is clear.  Eyes:     General: No scleral icterus.       Right eye: No discharge.        Left eye: No discharge.     Extraocular Movements: Extraocular movements intact.     Conjunctiva/sclera: Conjunctivae normal.     Pupils: Pupils are equal, round,  and reactive to light.  Cardiovascular:     Rate and Rhythm: Normal rate and regular rhythm.     Pulses: Normal pulses.     Heart sounds: Normal heart sounds. No murmur heard.    No friction rub. No gallop.  Pulmonary:     Effort: Pulmonary effort is normal. No respiratory distress.     Breath sounds: Normal breath sounds. No stridor. No wheezing, rhonchi or rales.  Chest:     Chest wall: No tenderness.  Musculoskeletal:        General: Normal range  of motion.     Cervical back: Normal range of motion and neck supple.  Skin:    General: Skin is warm and dry.     Capillary Refill: Capillary refill takes less than 2 seconds.     Coloration: Skin is not jaundiced or pale.     Findings: No bruising, erythema, lesion or rash.  Neurological:     General: No focal deficit present.     Mental Status: She is alert and oriented to person, place, and time. Mental status is at baseline.  Psychiatric:        Mood and Affect: Mood normal.        Behavior: Behavior normal.        Thought Content: Thought content normal.        Judgment: Judgment normal.     Results for orders placed or performed in visit on 06/04/24  Comprehensive metabolic panel with GFR   Collection Time: 06/04/24 10:32 AM  Result Value Ref Range   Glucose 85 70 - 99 mg/dL   BUN 10 6 - 24 mg/dL   Creatinine, Ser 9.20 0.57 - 1.00 mg/dL   eGFR 87 >40 fO/fpw/8.26   BUN/Creatinine Ratio 13 9 - 23   Sodium 128 (L) 134 - 144 mmol/L   Potassium 4.6 3.5 - 5.2 mmol/L   Chloride 91 (L) 96 - 106 mmol/L   CO2 20 20 - 29 mmol/L   Calcium  9.5 8.7 - 10.2 mg/dL   Total Protein 6.8 6.0 - 8.5 g/dL   Albumin 4.4 3.8 - 4.9 g/dL   Globulin, Total 2.4 1.5 - 4.5 g/dL   Bilirubin Total 0.4 0.0 - 1.2 mg/dL   Alkaline Phosphatase 89 49 - 135 IU/L   AST 18 0 - 40 IU/L   ALT 17 0 - 32 IU/L  CBC with Differential/Platelet   Collection Time: 06/04/24 10:32 AM  Result Value Ref Range   WBC 5.8 3.4 - 10.8 x10E3/uL   RBC 4.59 3.77 - 5.28 x10E6/uL   Hemoglobin 14.2 11.1 - 15.9 g/dL   Hematocrit 57.0 65.9 - 46.6 %   MCV 94 79 - 97 fL   MCH 30.9 26.6 - 33.0 pg   MCHC 33.1 31.5 - 35.7 g/dL   RDW 86.8 88.2 - 84.5 %   Platelets 307 150 - 450 x10E3/uL   Neutrophils 62 Not Estab. %   Lymphs 28 Not Estab. %   Monocytes 8 Not Estab. %   Eos 1 Not Estab. %   Basos 1 Not Estab. %   Neutrophils Absolute 3.6 1.4 - 7.0 x10E3/uL   Lymphocytes Absolute 1.6 0.7 - 3.1 x10E3/uL   Monocytes Absolute 0.5  0.1 - 0.9 x10E3/uL   EOS (ABSOLUTE) 0.1 0.0 - 0.4 x10E3/uL   Basophils Absolute 0.1 0.0 - 0.2 x10E3/uL   Immature Granulocytes 0 Not Estab. %   Immature Grans (Abs) 0.0 0.0 - 0.1 x10E3/uL  Lipid Panel  w/o Chol/HDL Ratio   Collection Time: 06/04/24 10:32 AM  Result Value Ref Range   Cholesterol, Total 174 100 - 199 mg/dL   Triglycerides 75 0 - 149 mg/dL   HDL 69 >60 mg/dL   VLDL Cholesterol Cal 14 5 - 40 mg/dL   LDL Chol Calc (NIH) 91 0 - 99 mg/dL  TSH   Collection Time: 06/04/24 10:32 AM  Result Value Ref Range   TSH 0.480 0.450 - 4.500 uIU/mL  VITAMIN D  25 Hydroxy (Vit-D Deficiency, Fractures)   Collection Time: 06/04/24 10:32 AM  Result Value Ref Range   Vit D, 25-Hydroxy 23.4 (L) 30.0 - 100.0 ng/mL  Hepatitis B surface antibody,quantitative   Collection Time: 06/04/24 10:32 AM  Result Value Ref Range   Hepatitis B Surf Ab Quant 3,568.0 Immunity>10 mIU/mL      Assessment & Plan:   Problem List Items Addressed This Visit       Endocrine   Hypothyroidism   Rechecking labs today. Await results. Treat as needed.       Relevant Orders   Comprehensive metabolic panel with GFR (Completed)   CBC with Differential/Platelet (Completed)   TSH (Completed)     Genitourinary   Benign hypertensive renal disease   Under good control on current regimen. Continue current regimen. Continue to monitor. Call with any concerns. Refills given. Labs drawn today.        Relevant Orders   Comprehensive metabolic panel with GFR (Completed)   CBC with Differential/Platelet (Completed)     Other   Vitamin D  deficiency   Rechecking labs today. Await results. Treat as needed.       Relevant Orders   Comprehensive metabolic panel with GFR (Completed)   CBC with Differential/Platelet (Completed)   VITAMIN D  25 Hydroxy (Vit-D Deficiency, Fractures) (Completed)   Hyperlipidemia   Under good control on current regimen. Continue current regimen. Continue to monitor. Call with any concerns.  Refills given. Labs drawn today.       Relevant Medications   atorvastatin  (LIPITOR) 20 MG tablet   hydrochlorothiazide  (HYDRODIURIL ) 12.5 MG tablet   lisinopril  (ZESTRIL ) 10 MG tablet   Other Relevant Orders   Comprehensive metabolic panel with GFR (Completed)   CBC with Differential/Platelet (Completed)   Lipid Panel w/o Chol/HDL Ratio (Completed)   Morbid obesity (HCC) - Primary   Due to HTN and HLD. Tolerating her medicine well and down 8.7% of her body weight not on highest dose. Will increase her to 5mg  and recheck 3 months. Call with any concerns.       Relevant Medications   tirzepatide  (ZEPBOUND ) 5 MG/0.5ML Pen   Other Relevant Orders   Comprehensive metabolic panel with GFR (Completed)   CBC with Differential/Platelet (Completed)   Other Visit Diagnoses       Hepatitis B vaccination status unknown       Labs drawn today. Await results.   Relevant Orders   Hepatitis B surface antibody,quantitative (Completed)        Follow up plan: Return in about 3 months (around 09/01/2024) for physical.      "

## 2024-06-04 NOTE — Telephone Encounter (Signed)
 Patient has appointment with PCP this morning. Will handle then

## 2024-06-05 ENCOUNTER — Ambulatory Visit: Payer: Self-pay | Admitting: Family Medicine

## 2024-06-05 ENCOUNTER — Encounter: Payer: Self-pay | Admitting: Family Medicine

## 2024-06-05 LAB — COMPREHENSIVE METABOLIC PANEL WITH GFR
ALT: 17 [IU]/L (ref 0–32)
AST: 18 [IU]/L (ref 0–40)
Albumin: 4.4 g/dL (ref 3.8–4.9)
Alkaline Phosphatase: 89 [IU]/L (ref 49–135)
BUN/Creatinine Ratio: 13 (ref 9–23)
BUN: 10 mg/dL (ref 6–24)
Bilirubin Total: 0.4 mg/dL (ref 0.0–1.2)
CO2: 20 mmol/L (ref 20–29)
Calcium: 9.5 mg/dL (ref 8.7–10.2)
Chloride: 91 mmol/L — ABNORMAL LOW (ref 96–106)
Creatinine, Ser: 0.79 mg/dL (ref 0.57–1.00)
Globulin, Total: 2.4 g/dL (ref 1.5–4.5)
Glucose: 85 mg/dL (ref 70–99)
Potassium: 4.6 mmol/L (ref 3.5–5.2)
Sodium: 128 mmol/L — ABNORMAL LOW (ref 134–144)
Total Protein: 6.8 g/dL (ref 6.0–8.5)
eGFR: 87 mL/min/{1.73_m2}

## 2024-06-05 LAB — CBC WITH DIFFERENTIAL/PLATELET
Basophils Absolute: 0.1 10*3/uL (ref 0.0–0.2)
Basos: 1 %
EOS (ABSOLUTE): 0.1 10*3/uL (ref 0.0–0.4)
Eos: 1 %
Hematocrit: 42.9 % (ref 34.0–46.6)
Hemoglobin: 14.2 g/dL (ref 11.1–15.9)
Immature Grans (Abs): 0 10*3/uL (ref 0.0–0.1)
Immature Granulocytes: 0 %
Lymphocytes Absolute: 1.6 10*3/uL (ref 0.7–3.1)
Lymphs: 28 %
MCH: 30.9 pg (ref 26.6–33.0)
MCHC: 33.1 g/dL (ref 31.5–35.7)
MCV: 94 fL (ref 79–97)
Monocytes Absolute: 0.5 10*3/uL (ref 0.1–0.9)
Monocytes: 8 %
Neutrophils Absolute: 3.6 10*3/uL (ref 1.4–7.0)
Neutrophils: 62 %
Platelets: 307 10*3/uL (ref 150–450)
RBC: 4.59 x10E6/uL (ref 3.77–5.28)
RDW: 13.1 % (ref 11.7–15.4)
WBC: 5.8 10*3/uL (ref 3.4–10.8)

## 2024-06-05 LAB — LIPID PANEL W/O CHOL/HDL RATIO
Cholesterol, Total: 174 mg/dL (ref 100–199)
HDL: 69 mg/dL
LDL Chol Calc (NIH): 91 mg/dL (ref 0–99)
Triglycerides: 75 mg/dL (ref 0–149)
VLDL Cholesterol Cal: 14 mg/dL (ref 5–40)

## 2024-06-05 LAB — VITAMIN D 25 HYDROXY (VIT D DEFICIENCY, FRACTURES): Vit D, 25-Hydroxy: 23.4 ng/mL — ABNORMAL LOW (ref 30.0–100.0)

## 2024-06-05 LAB — TSH: TSH: 0.48 u[IU]/mL (ref 0.450–4.500)

## 2024-06-05 LAB — HEPATITIS B SURFACE ANTIBODY, QUANTITATIVE: Hepatitis B Surf Ab Quant: 3568 m[IU]/mL

## 2024-06-05 MED ORDER — LEVOTHYROXINE SODIUM 75 MCG PO TABS: 75.0000 ug | ORAL_TABLET | Freq: Every day | ORAL | 3 refills | Status: AC

## 2024-06-05 NOTE — Assessment & Plan Note (Signed)
 Under good control on current regimen. Continue current regimen. Continue to monitor. Call with any concerns. Refills given. Labs drawn today.

## 2024-06-05 NOTE — Assessment & Plan Note (Signed)
 Due to HTN and HLD. Tolerating her medicine well and down 8.7% of her body weight not on highest dose. Will increase her to 5mg  and recheck 3 months. Call with any concerns.

## 2024-06-05 NOTE — Assessment & Plan Note (Signed)
 Rechecking labs today. Await results. Treat as needed.

## 2024-09-02 ENCOUNTER — Encounter: Admitting: Family Medicine
